# Patient Record
Sex: Male | Born: 1960 | Race: White | Hispanic: No | Marital: Married | Smoking: Former smoker
Health system: Southern US, Community
[De-identification: ages and names within clinical notes are randomized; demographics above are authoritative.]

## PROBLEM LIST (undated history)

## (undated) DIAGNOSIS — K219 Gastro-esophageal reflux disease without esophagitis: Secondary | ICD-10-CM

## (undated) DIAGNOSIS — T7840XA Allergy, unspecified, initial encounter: Secondary | ICD-10-CM

## (undated) DIAGNOSIS — I10 Essential (primary) hypertension: Secondary | ICD-10-CM

## (undated) DIAGNOSIS — Z9889 Other specified postprocedural states: Secondary | ICD-10-CM

## (undated) DIAGNOSIS — M659 Synovitis and tenosynovitis, unspecified: Secondary | ICD-10-CM

## (undated) DIAGNOSIS — E785 Hyperlipidemia, unspecified: Secondary | ICD-10-CM

## (undated) DIAGNOSIS — R112 Nausea with vomiting, unspecified: Secondary | ICD-10-CM

## (undated) DIAGNOSIS — M1990 Unspecified osteoarthritis, unspecified site: Secondary | ICD-10-CM

## (undated) DIAGNOSIS — Z973 Presence of spectacles and contact lenses: Secondary | ICD-10-CM

## (undated) DIAGNOSIS — M7711 Lateral epicondylitis, right elbow: Secondary | ICD-10-CM

## (undated) DIAGNOSIS — B029 Zoster without complications: Secondary | ICD-10-CM

## (undated) DIAGNOSIS — M7701 Medial epicondylitis, right elbow: Secondary | ICD-10-CM

## (undated) HISTORY — PX: UPPER GI ENDOSCOPY: SHX6162

## (undated) HISTORY — DX: Synovitis and tenosynovitis, unspecified: M65.9

## (undated) HISTORY — PX: WISDOM TOOTH EXTRACTION: SHX21

## (undated) HISTORY — DX: Hyperlipidemia, unspecified: E78.5

## (undated) HISTORY — PX: FRACTURE SURGERY: SHX138

## (undated) HISTORY — DX: Essential (primary) hypertension: I10

## (undated) HISTORY — DX: Zoster without complications: B02.9

## (undated) HISTORY — DX: Lateral epicondylitis, right elbow: M77.11

## (undated) HISTORY — DX: Allergy, unspecified, initial encounter: T78.40XA

## (undated) HISTORY — PX: VASECTOMY: SHX75

## (undated) HISTORY — DX: Medial epicondylitis, right elbow: M77.01

---
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
## 1980-09-04 HISTORY — PX: ORIF METACARPAL FRACTURE: SUR940

## 1997-09-04 HISTORY — PX: SHOULDER ARTHROSCOPY W/ LABRAL REPAIR: SHX2399

## 2000-05-24 ENCOUNTER — Encounter (INDEPENDENT_AMBULATORY_CARE_PROVIDER_SITE_OTHER): Payer: Self-pay | Admitting: Specialist

## 2000-05-24 ENCOUNTER — Other Ambulatory Visit: Admission: RE | Admit: 2000-05-24 | Discharge: 2000-05-24 | Payer: Self-pay | Admitting: Urology

## 2002-06-13 ENCOUNTER — Encounter: Admission: RE | Admit: 2002-06-13 | Discharge: 2002-06-13 | Payer: Self-pay | Admitting: Internal Medicine

## 2002-06-13 ENCOUNTER — Encounter: Payer: Self-pay | Admitting: Internal Medicine

## 2002-11-27 ENCOUNTER — Emergency Department (HOSPITAL_COMMUNITY): Admission: EM | Admit: 2002-11-27 | Discharge: 2002-11-27 | Payer: Self-pay | Admitting: Emergency Medicine

## 2003-09-05 HISTORY — PX: SHOULDER ARTHROSCOPY W/ ROTATOR CUFF REPAIR: SHX2400

## 2003-12-07 ENCOUNTER — Ambulatory Visit (HOSPITAL_BASED_OUTPATIENT_CLINIC_OR_DEPARTMENT_OTHER): Admission: RE | Admit: 2003-12-07 | Discharge: 2003-12-07 | Payer: Self-pay | Admitting: Orthopedic Surgery

## 2004-08-10 ENCOUNTER — Encounter: Admission: RE | Admit: 2004-08-10 | Discharge: 2004-08-10 | Payer: Self-pay | Admitting: Neurosurgery

## 2004-08-25 ENCOUNTER — Encounter: Admission: RE | Admit: 2004-08-25 | Discharge: 2004-08-25 | Payer: Self-pay | Admitting: Neurosurgery

## 2004-10-28 ENCOUNTER — Encounter: Admission: RE | Admit: 2004-10-28 | Discharge: 2004-10-28 | Payer: Self-pay | Admitting: Neurosurgery

## 2005-03-29 ENCOUNTER — Emergency Department (HOSPITAL_COMMUNITY): Admission: EM | Admit: 2005-03-29 | Discharge: 2005-03-29 | Payer: Self-pay | Admitting: Emergency Medicine

## 2005-08-02 ENCOUNTER — Encounter: Admission: RE | Admit: 2005-08-02 | Discharge: 2005-08-02 | Payer: Self-pay | Admitting: Neurosurgery

## 2005-08-16 ENCOUNTER — Encounter: Admission: RE | Admit: 2005-08-16 | Discharge: 2005-08-16 | Payer: Self-pay | Admitting: Neurosurgery

## 2009-06-08 ENCOUNTER — Ambulatory Visit: Payer: Self-pay | Admitting: Gastroenterology

## 2009-06-17 ENCOUNTER — Ambulatory Visit: Payer: Self-pay | Admitting: Gastroenterology

## 2009-06-17 HISTORY — PX: COLONOSCOPY: SHX174

## 2011-01-20 ENCOUNTER — Ambulatory Visit: Payer: 59 | Attending: Internal Medicine | Admitting: Physical Therapy

## 2011-01-20 ENCOUNTER — Ambulatory Visit: Payer: 59

## 2011-01-20 DIAGNOSIS — M2560 Stiffness of unspecified joint, not elsewhere classified: Secondary | ICD-10-CM | POA: Insufficient documentation

## 2011-01-20 DIAGNOSIS — M545 Low back pain, unspecified: Secondary | ICD-10-CM | POA: Insufficient documentation

## 2011-01-20 DIAGNOSIS — IMO0001 Reserved for inherently not codable concepts without codable children: Secondary | ICD-10-CM | POA: Insufficient documentation

## 2011-01-20 NOTE — Op Note (Signed)
NAME:  Andrew Nolan, Andrew Nolan                           ACCOUNT NO.:  [id number]   MEDICAL RECORD NO.:  [id number]                   PATIENT TYPE:  AMB   LOCATION:  DSC                                  FACILITY:  [facility]   PHYSICIAN:  Robert A. Thurston Hole, M.D.              DATE OF BIRTH:  [birth year]   DATE OF PROCEDURE:  12/07/2003  DATE OF DISCHARGE:                                 OPERATIVE REPORT   PREOPERATIVE DIAGNOSIS:  Left shoulder partial rotator cuff tear with  partial labrum tear and impingement.   POSTOPERATIVE DIAGNOSIS:  Left shoulder partial rotator cuff tear with  partial labrum tear and impingement.   OPERATION PERFORMED:  1. Left shoulder examination under anesthesia followed by arthroscopic     partial labral tear debridement and partial rotator cuff  tear     debridement.  2. Left shoulder subacromial decompression.   SURGEON:  Elana Alm. Thurston Hole, M.D.   ASSISTANT:  Julien Girt, P.A.   ANESTHESIA:  General.   OPERATIVE TIME:  45 minutes.   COMPLICATIONS:  None.   INDICATIONS FOR PROCEDURE:  Andrew Nolan is a 50 year old gentleman who injured his  left shoulder in November 2004 with a pulling type injury.  He has had  significant persistent pain with exam and MRI documenting a partial rotator  cuff tear, who has failed conservative care and is now to undergo  arthroscopy.   DESCRIPTION OF PROCEDURE:  Andrew Nolan was brought to the operating room on  December 07, 2003 after an interscalene block had been placed in the holding  room by anesthesia.  He was placed on the operating table in supine  position.  After being placed under general anesthesia, his left shoulder  was examined under anesthesia.  He had full range of motion and the shoulder  was stable to ligamentous exam.  He was then placed in beach chair position  and the shoulder and arm was prepped using sterile DuraPrep using sterile  technique.  Originally through a posterior arthroscopic portal, the  arthroscope  with a pump attached was placed and through an anterior portal  an arthroscopic probe was placed.  On initial inspection, the articular  cartilage in the glenohumeral joint was intact, anterior labrum partial  tearing 25% which was debrided.  Inferior labrum and anterior inferior  glenohumeral ligament complex was intact.  Superior labrum, biceps tendon  anchor was intact.  The biceps tendon was intact.  The posterior labrum  partial tearing 25 to 30% and this was debrided as well.  Rotator cuff on  the articular surface showed no evidence of a tear.  Inferior capsular  recess was free of pathology.  Anterior inferior glenohumeral ligament  complex was intact.  The subacromial space was entered and a lateral  arthroscopic portal was made.  Moderately thickened bursitis was resected.  Underneath this, the rotator cuff showed partial tearing and fraying of the  supraspinatus  and infraspinatus which was debrided.  A complete tear was not  found.  Impingement was noted and a subacromial decompression was carried  out as well as a CA ligament release.  The acromioclavicular joint was not  disturbed.  The shoulder could be brought through a full range of motion  with no impingement on the rotator cuff after this was done.  At this point  it was felt that all pathology had been satisfactorily addressed.  The  instruments were removed.  The portals were closed with 3-0 nylon suture.  Sterile dressings and a sling applied.  The patient then awakened and taken  to recovery room in stable condition.   FOLLOW UP:  Andrew Nolan will be followed as an outpatient on Vicodin and  Naprosyn.  See back in the office in a week for sutures out and follow-up.                                               Robert A. Thurston Hole, M.D.    RAW/MEDQ  D:  12/07/2003  T:  12/07/2003  Job:  [id number]

## 2011-01-25 ENCOUNTER — Ambulatory Visit: Payer: 59

## 2011-01-27 ENCOUNTER — Ambulatory Visit: Payer: 59 | Admitting: Physical Therapy

## 2011-01-31 ENCOUNTER — Ambulatory Visit: Payer: 59

## 2011-02-02 ENCOUNTER — Ambulatory Visit: Payer: 59

## 2011-02-07 ENCOUNTER — Ambulatory Visit: Payer: 59 | Attending: Internal Medicine

## 2011-02-07 DIAGNOSIS — M2560 Stiffness of unspecified joint, not elsewhere classified: Secondary | ICD-10-CM | POA: Insufficient documentation

## 2011-02-07 DIAGNOSIS — M545 Low back pain, unspecified: Secondary | ICD-10-CM | POA: Insufficient documentation

## 2011-02-07 DIAGNOSIS — IMO0001 Reserved for inherently not codable concepts without codable children: Secondary | ICD-10-CM | POA: Insufficient documentation

## 2011-02-09 ENCOUNTER — Ambulatory Visit: Payer: 59

## 2011-02-14 ENCOUNTER — Ambulatory Visit: Payer: 59

## 2011-02-16 ENCOUNTER — Ambulatory Visit: Payer: 59

## 2012-08-02 ENCOUNTER — Ambulatory Visit (INDEPENDENT_AMBULATORY_CARE_PROVIDER_SITE_OTHER): Payer: 59 | Admitting: Family Medicine

## 2012-08-02 VITALS — BP 142/82 | HR 64 | Temp 98.8°F | Resp 17 | Ht 71.5 in | Wt 205.0 lb

## 2012-08-02 DIAGNOSIS — H6090 Unspecified otitis externa, unspecified ear: Secondary | ICD-10-CM

## 2012-08-02 DIAGNOSIS — H60399 Other infective otitis externa, unspecified ear: Secondary | ICD-10-CM

## 2012-08-02 MED ORDER — OFLOXACIN 0.3 % OT SOLN: 10.0000 [drp] | Freq: Every day | OTIC | Status: DC

## 2012-08-02 MED ORDER — ANTIPYRINE-BENZOCAINE 5.4-1.4 % OT SOLN: 3.0000 [drp] | OTIC | Status: DC | PRN

## 2012-08-02 NOTE — Patient Instructions (Addendum)
Please let me know if you are not better in the next couple of days 

## 2012-08-02 NOTE — Progress Notes (Signed)
[facility] [address] [place] [address] [phone]  Date:  08/02/2012   Name:  Andrew Nolan   DOB:  1960-10-28   MRN:  [id number]  PCP:  No primary provider on file.    Chief Complaint: Otalgia   History of Present Illness:  Andrew Nolan is a 51 y.o. very pleasant male patient who presents with the following:  He has noted pain in his right ear.  It had seemed congested for a few days, but 2 days ago it began to hurt more.  It was very painful last night.  His glasses being over his ear and touching the ear causes tenderness He has tried sudafed and tylenol- helped some.  He has not noted a fever.   He has not had a ST, cough, etc.  Some sinus congestion but this is not unusual for him.    He is generally healthy, no medications or known health problems  There is no problem list on file for this patient.   History reviewed. No pertinent past medical history.  Past Surgical History  Procedure Date  . Fracture surgery   . Vasectomy     History  Substance Use Topics  . Smoking status: Never Smoker   . Smokeless tobacco: Not on file  . Alcohol Use: No    History reviewed. No pertinent family history.  Allergies  Allergen Reactions  . Codeine     REACTION: itching    Medication list has been reviewed and updated.  No current outpatient prescriptions on file prior to visit.    Review of Systems:  As per HPI- otherwise negative.   Physical Examination: Filed Vitals:   08/02/12 1054  BP: 142/82  Pulse: 64  Temp: 98.8 F (37.1 C)  Resp: 17   Filed Vitals:   08/02/12 1054  Height: 5' 11.5" (1.816 m)  Weight: 205 lb (92.987 kg)   Body mass index is 28.19 kg/(m^2). Ideal Body Weight: Weight in (lb) to have BMI = 25: 181.4   GEN: WDWN, NAD, Non-toxic, A & O x 3 HEENT: Atraumatic, Normocephalic. Neck supple. No masses, No LAD. Bilateral TM wnl.  Left external canal normal, right canal with slight swelling, redness and  discharge.  Nasal cavity normal, oropharynx normal.  PEERL,EOMI.   Ears and Nose: No external deformity. CV: RRR, No M/G/R. No JVD. No thrill. No extra heart sounds. PULM: CTA B, no wheezes, crackles, rhonchi. No retractions. No resp. distress. No accessory muscle use EXTR: No c/c/e NEURO Normal gait.  PSYCH: Normally interactive. Conversant. Not depressed or anxious appearing.  Calm demeanor.    Assessment and Plan: 1. Otitis externa  ofloxacin (FLOXIN OTIC) 0.3 % otic solution, antipyrine-benzocaine (AURALGAN) otic solution   Treat with floxin otic drops and auralgan drops as needed.  Patient (or parent if minor) instructed to return to clinic or call if not better in 2 day(s).   Abbe Amsterdam, MD

## 2012-11-03 ENCOUNTER — Ambulatory Visit (INDEPENDENT_AMBULATORY_CARE_PROVIDER_SITE_OTHER): Payer: 59 | Admitting: Emergency Medicine

## 2012-11-03 VITALS — BP 162/80 | HR 98 | Temp 98.0°F | Resp 17 | Ht 71.5 in | Wt 202.0 lb

## 2012-11-03 DIAGNOSIS — B029 Zoster without complications: Secondary | ICD-10-CM

## 2012-11-03 MED ORDER — VALACYCLOVIR HCL 1 G PO TABS: 1000.0000 mg | ORAL_TABLET | Freq: Three times a day (TID) | ORAL | Status: DC

## 2012-11-03 NOTE — Patient Instructions (Addendum)

## 2012-11-03 NOTE — Progress Notes (Signed)
[facility] [address] [place] [address] [phone]  Date:  11/03/2012   Name:  Andrew Nolan   DOB:  10-08-1960   MRN:  [id number]  PCP:  No primary provider on file.    Chief Complaint: Sinusitis and Facial Swelling   History of Present Illness:  Andrew Nolan is a 52 y.o. very pleasant male patient who presents with the following:  Awoke this morning with erythema and swelling around left eye, particularly involving the lower lid.  No fever or chills.  No visual symptoms.  Some headache on the left side.  Pain in teeth. No nasal congestion or drainage.  No ear pain.  No coryza, nausea or vomiting or cough.  No wheezing or shortness of breath.  No improvement with over the counter medications or other home remedies.   There is no problem list on file for this patient.   Past Medical History  Diagnosis Date  . Allergy     Past Surgical History  Procedure Laterality Date  . Fracture surgery    . Vasectomy      History  Substance Use Topics  . Smoking status: Never Smoker   . Smokeless tobacco: Not on file  . Alcohol Use: No    History reviewed. No pertinent family history.  Allergies  Allergen Reactions  . Codeine     REACTION: itching    Medication list has been reviewed and updated.  No current outpatient prescriptions on file prior to visit.   No current facility-administered medications on file prior to visit.    Review of Systems:  As per HPI, otherwise negative.    Physical Examination: Filed Vitals:   11/03/12 1500  BP: 162/80  Pulse: 98  Temp: 98 F (36.7 C)  Resp: 17   Filed Vitals:   11/03/12 1500  Height: 5' 11.5" (1.816 m)  Weight: 202 lb (91.627 kg)   Body mass index is 27.78 kg/(m^2). Ideal Body Weight: Weight in (lb) to have BMI = 25: 181.4  GEN: WDWN, NAD, Non-toxic, A & O x 3 HEENT: Atraumatic, Normocephalic. Neck supple. No masses, No LAD. Ears and Nose: No external deformity. CV: RRR, No  M/G/R. No JVD. No thrill. No extra heart sounds. PULM: CTA B, no wheezes, crackles, rhonchi. No retractions. No resp. distress. No accessory muscle use. ABD: S, NT, ND, +BS. No rebound. No HSM. EXTR: No c/c/e NEURO Normal gait.  PSYCH: Normally interactive. Conversant. Not depressed or anxious appearing.  Calm demeanor.  SKIN:  Erythematous eruption left forehead, cheek and temple  Assessment and Plan: Shingles Valtrex vicodin Follow up tomorrow with eye doctor to insure no infection.  Carmelina Dane, MD

## 2013-11-05 ENCOUNTER — Encounter (HOSPITAL_BASED_OUTPATIENT_CLINIC_OR_DEPARTMENT_OTHER): Payer: Self-pay | Admitting: *Deleted

## 2013-11-05 NOTE — Progress Notes (Signed)
No labs needed

## 2013-11-11 ENCOUNTER — Other Ambulatory Visit: Payer: Self-pay | Admitting: Physician Assistant

## 2013-11-11 ENCOUNTER — Ambulatory Visit (HOSPITAL_BASED_OUTPATIENT_CLINIC_OR_DEPARTMENT_OTHER): Payer: 59 | Admitting: Anesthesiology

## 2013-11-11 ENCOUNTER — Encounter (HOSPITAL_BASED_OUTPATIENT_CLINIC_OR_DEPARTMENT_OTHER)
Admission: RE | Disposition: A | Discharge status: Home / Self Care | Payer: Self-pay | Source: Ambulatory Visit | Attending: Orthopedic Surgery

## 2013-11-11 ENCOUNTER — Encounter (HOSPITAL_BASED_OUTPATIENT_CLINIC_OR_DEPARTMENT_OTHER): Payer: 59 | Admitting: Anesthesiology

## 2013-11-11 ENCOUNTER — Encounter: Payer: Self-pay | Admitting: Physician Assistant

## 2013-11-11 ENCOUNTER — Encounter (HOSPITAL_BASED_OUTPATIENT_CLINIC_OR_DEPARTMENT_OTHER): Payer: Self-pay

## 2013-11-11 ENCOUNTER — Encounter (HOSPITAL_BASED_OUTPATIENT_CLINIC_OR_DEPARTMENT_OTHER): Payer: Self-pay | Admitting: Anesthesiology

## 2013-11-11 ENCOUNTER — Ambulatory Visit (HOSPITAL_BASED_OUTPATIENT_CLINIC_OR_DEPARTMENT_OTHER)
Admission: RE | Admit: 2013-11-11 | Discharge: 2013-11-11 | Disposition: A | Discharge status: Home / Self Care | Payer: 59 | Source: Ambulatory Visit | Attending: Orthopedic Surgery | Admitting: Orthopedic Surgery

## 2013-11-11 DIAGNOSIS — M659 Synovitis and tenosynovitis, unspecified: Secondary | ICD-10-CM | POA: Diagnosis present

## 2013-11-11 DIAGNOSIS — M7711 Lateral epicondylitis, right elbow: Secondary | ICD-10-CM | POA: Diagnosis present

## 2013-11-11 DIAGNOSIS — M65939 Unspecified synovitis and tenosynovitis, unspecified forearm: Secondary | ICD-10-CM

## 2013-11-11 DIAGNOSIS — M7700 Medial epicondylitis, unspecified elbow: Secondary | ICD-10-CM | POA: Insufficient documentation

## 2013-11-11 DIAGNOSIS — M7710 Lateral epicondylitis, unspecified elbow: Secondary | ICD-10-CM | POA: Insufficient documentation

## 2013-11-11 DIAGNOSIS — M7701 Medial epicondylitis, right elbow: Secondary | ICD-10-CM

## 2013-11-11 DIAGNOSIS — K219 Gastro-esophageal reflux disease without esophagitis: Secondary | ICD-10-CM | POA: Insufficient documentation

## 2013-11-11 HISTORY — DX: Other specified postprocedural states: Z98.890

## 2013-11-11 HISTORY — DX: Nausea with vomiting, unspecified: R11.2

## 2013-11-11 HISTORY — DX: Gastro-esophageal reflux disease without esophagitis: K21.9

## 2013-11-11 HISTORY — DX: Presence of spectacles and contact lenses: Z97.3

## 2013-11-11 HISTORY — PX: LATERAL EPICONDYLE RELEASE: SHX1958

## 2013-11-11 HISTORY — DX: Unspecified osteoarthritis, unspecified site: M19.90

## 2013-11-11 LAB — POCT HEMOGLOBIN-HEMACUE: Hemoglobin: 18.1 g/dL — ABNORMAL HIGH (ref 13.0–17.0)

## 2013-11-11 SURGERY — TENNIS ELBOW RELEASE/NIRSCHEL PROCEDURE
Anesthesia: General | Site: Elbow | Laterality: Right

## 2013-11-11 MED ORDER — BUPIVACAINE HCL (PF) 0.25 % IJ SOLN
INTRAMUSCULAR | Status: AC
  Filled 2013-11-11: qty 30

## 2013-11-11 MED ORDER — FENTANYL CITRATE 0.05 MG/ML IJ SOLN
INTRAMUSCULAR | Status: AC
  Filled 2013-11-11: qty 2

## 2013-11-11 MED ORDER — FENTANYL CITRATE 0.05 MG/ML IJ SOLN
50.0000 ug | INTRAMUSCULAR | Status: DC | PRN
  Administered 2013-11-11: 100 ug via INTRAVENOUS

## 2013-11-11 MED ORDER — ONDANSETRON HCL 4 MG/2ML IJ SOLN
INTRAMUSCULAR | Status: DC | PRN
  Administered 2013-11-11: 4 mg via INTRAVENOUS

## 2013-11-11 MED ORDER — LACTATED RINGERS IV SOLN: INTRAVENOUS | Status: DC

## 2013-11-11 MED ORDER — SUCCINYLCHOLINE CHLORIDE 20 MG/ML IJ SOLN
INTRAMUSCULAR | Status: DC | PRN
  Administered 2013-11-11: 50 mg via INTRAVENOUS

## 2013-11-11 MED ORDER — MIDAZOLAM HCL 2 MG/2ML IJ SOLN
1.0000 mg | INTRAMUSCULAR | Status: DC | PRN
  Administered 2013-11-11: 2 mg via INTRAVENOUS

## 2013-11-11 MED ORDER — MIDAZOLAM HCL 2 MG/2ML IJ SOLN
INTRAMUSCULAR | Status: AC
  Filled 2013-11-11: qty 2

## 2013-11-11 MED ORDER — BUPIVACAINE HCL (PF) 0.25 % IJ SOLN
INTRAMUSCULAR | Status: DC | PRN
  Administered 2013-11-11: 1 mL via INTRA_ARTICULAR

## 2013-11-11 MED ORDER — FENTANYL CITRATE 0.05 MG/ML IJ SOLN
INTRAMUSCULAR | Status: DC | PRN
  Administered 2013-11-11: 100 ug via INTRAVENOUS

## 2013-11-11 MED ORDER — CHLORHEXIDINE GLUCONATE 4 % EX LIQD: 60.0000 mL | Freq: Once | CUTANEOUS | Status: DC

## 2013-11-11 MED ORDER — FENTANYL CITRATE 0.05 MG/ML IJ SOLN: 25.0000 ug | INTRAMUSCULAR | Status: DC | PRN

## 2013-11-11 MED ORDER — CEFAZOLIN SODIUM-DEXTROSE 2-3 GM-% IV SOLR
INTRAVENOUS | Status: DC | PRN
  Administered 2013-11-11: 2 g via INTRAVENOUS

## 2013-11-11 MED ORDER — LIDOCAINE HCL (CARDIAC) 20 MG/ML IV SOLN
INTRAVENOUS | Status: DC | PRN
  Administered 2013-11-11: 40 mg via INTRAVENOUS

## 2013-11-11 MED ORDER — METHYLPREDNISOLONE ACETATE 40 MG/ML IJ SUSP
INTRAMUSCULAR | Status: AC
  Filled 2013-11-11: qty 1

## 2013-11-11 MED ORDER — LACTATED RINGERS IV SOLN
INTRAVENOUS | Status: DC
  Administered 2013-11-11 (×2): via INTRAVENOUS

## 2013-11-11 MED ORDER — FENTANYL CITRATE 0.05 MG/ML IJ SOLN
INTRAMUSCULAR | Status: AC
  Filled 2013-11-11: qty 6

## 2013-11-11 MED ORDER — DEXAMETHASONE SODIUM PHOSPHATE 4 MG/ML IJ SOLN
INTRAMUSCULAR | Status: DC | PRN
  Administered 2013-11-11: 10 mg via INTRAVENOUS

## 2013-11-11 MED ORDER — METHYLPREDNISOLONE ACETATE 40 MG/ML IJ SUSP
INTRAMUSCULAR | Status: DC | PRN
  Administered 2013-11-11: 40 mg via INTRA_ARTICULAR

## 2013-11-11 MED ORDER — PROPOFOL 10 MG/ML IV BOLUS
INTRAVENOUS | Status: DC | PRN
  Administered 2013-11-11: 200 mg via INTRAVENOUS

## 2013-11-11 MED ORDER — CEFAZOLIN SODIUM-DEXTROSE 2-3 GM-% IV SOLR: 2.0000 g | INTRAVENOUS | Status: DC

## 2013-11-11 MED ORDER — CEFAZOLIN SODIUM-DEXTROSE 2-3 GM-% IV SOLR
INTRAVENOUS | Status: AC
  Filled 2013-11-11: qty 50

## 2013-11-11 MED ORDER — BUPIVACAINE-EPINEPHRINE PF 0.25-1:200000 % IJ SOLN
INTRAMUSCULAR | Status: AC
  Filled 2013-11-11: qty 30

## 2013-11-11 MED ORDER — MIDAZOLAM HCL 5 MG/5ML IJ SOLN
INTRAMUSCULAR | Status: DC | PRN
  Administered 2013-11-11: 2 mg via INTRAVENOUS

## 2013-11-11 SURGICAL SUPPLY — 78 items
APL SKNCLS STERI-STRIP NONHPOA (GAUZE/BANDAGES/DRESSINGS)
BANDAGE ELASTIC 4 VELCRO ST LF (GAUZE/BANDAGES/DRESSINGS) ×5 IMPLANT
BANDAGE ELASTIC 6 VELCRO ST LF (GAUZE/BANDAGES/DRESSINGS) IMPLANT
BENZOIN TINCTURE PRP APPL 2/3 (GAUZE/BANDAGES/DRESSINGS) IMPLANT
BIT DRILL 5/64X5 DISP (BIT) ×2 IMPLANT
BLADE SURG 15 STRL LF DISP TIS (BLADE) ×1 IMPLANT
BLADE SURG 15 STRL SS (BLADE) ×6
BNDG CMPR 9X4 STRL LF SNTH (GAUZE/BANDAGES/DRESSINGS) ×1
BNDG COHESIVE 4X5 TAN STRL (GAUZE/BANDAGES/DRESSINGS) ×3 IMPLANT
BNDG ESMARK 4X9 LF (GAUZE/BANDAGES/DRESSINGS) ×3 IMPLANT
BNDG GAUZE ELAST 4 BULKY (GAUZE/BANDAGES/DRESSINGS) ×3 IMPLANT
CLOSURE WOUND 1/2 X4 (GAUZE/BANDAGES/DRESSINGS) ×1
COVER TABLE BACK 60X90 (DRAPES) ×3 IMPLANT
CUFF TOURNIQUET SINGLE 18IN (TOURNIQUET CUFF) ×2 IMPLANT
DRAPE EXTREMITY T 121X128X90 (DRAPE) ×3 IMPLANT
DRAPE U 20/CS (DRAPES) ×3 IMPLANT
DRAPE U-SHAPE 47X51 STRL (DRAPES) ×4 IMPLANT
DRSG EMULSION OIL 3X3 NADH (GAUZE/BANDAGES/DRESSINGS) IMPLANT
DURAPREP 26ML APPLICATOR (WOUND CARE) ×3 IMPLANT
ELECT REM PT RETURN 9FT ADLT (ELECTROSURGICAL) ×3
ELECTRODE REM PT RTRN 9FT ADLT (ELECTROSURGICAL) ×1 IMPLANT
GAUZE XEROFORM 1X8 LF (GAUZE/BANDAGES/DRESSINGS) ×2 IMPLANT
GLOVE BIO SURGEON STRL SZ7 (GLOVE) ×3 IMPLANT
GLOVE BIOGEL PI IND STRL 7.0 (GLOVE) ×1 IMPLANT
GLOVE BIOGEL PI IND STRL 7.5 (GLOVE) ×1 IMPLANT
GLOVE BIOGEL PI INDICATOR 7.0 (GLOVE) ×6
GLOVE BIOGEL PI INDICATOR 7.5 (GLOVE) ×2
GLOVE ECLIPSE 6.5 STRL STRAW (GLOVE) ×4 IMPLANT
GLOVE SS BIOGEL STRL SZ 7.5 (GLOVE) ×1 IMPLANT
GLOVE SUPERSENSE BIOGEL SZ 7.5 (GLOVE) ×2
GOWN STRL REUS W/ TWL LRG LVL3 (GOWN DISPOSABLE) ×3 IMPLANT
GOWN STRL REUS W/TWL LRG LVL3 (GOWN DISPOSABLE) ×12
LOOP VESSEL MAXI BLUE (MISCELLANEOUS) IMPLANT
NDL ADDISON D1/2 CIR (NEEDLE) IMPLANT
NDL HYPO 25X1 1.5 SAFETY (NEEDLE) ×1 IMPLANT
NDL SAFETY ECLIPSE 18X1.5 (NEEDLE) IMPLANT
NDL SUT 6 .5 CRC .975X.05 MAYO (NEEDLE) IMPLANT
NEEDLE ADDISON D1/2 CIR (NEEDLE) IMPLANT
NEEDLE FISTULA 1/2 CIRCLE (NEEDLE) ×1 IMPLANT
NEEDLE HYPO 18GX1.5 SHARP (NEEDLE) ×3
NEEDLE HYPO 25X1 1.5 SAFETY (NEEDLE) ×3 IMPLANT
NEEDLE MAYO TAPER (NEEDLE)
NS IRRIG 1000ML POUR BTL (IV SOLUTION) ×3 IMPLANT
PACK BASIN DAY SURGERY FS (CUSTOM PROCEDURE TRAY) ×3 IMPLANT
PAD CAST 3X4 CTTN HI CHSV (CAST SUPPLIES) ×1 IMPLANT
PAD CAST 4YDX4 CTTN HI CHSV (CAST SUPPLIES) ×1 IMPLANT
PADDING CAST ABS 4INX4YD NS (CAST SUPPLIES) ×2
PADDING CAST ABS COTTON 4X4 ST (CAST SUPPLIES) ×1 IMPLANT
PADDING CAST COTTON 3X4 STRL (CAST SUPPLIES)
PADDING CAST COTTON 4X4 STRL (CAST SUPPLIES) ×3
PASSER SUT SWANSON 36MM LOOP (INSTRUMENTS) IMPLANT
PENCIL BUTTON HOLSTER BLD 10FT (ELECTRODE) ×3 IMPLANT
SHEET MEDIUM DRAPE 40X70 STRL (DRAPES) ×2 IMPLANT
SLEEVE SCD COMPRESS KNEE MED (MISCELLANEOUS) ×2 IMPLANT
SPLINT FAST PLASTER 5X30 (CAST SUPPLIES) ×20
SPLINT PLASTER CAST FAST 5X30 (CAST SUPPLIES) ×10 IMPLANT
SPONGE GAUZE 4X4 12PLY (GAUZE/BANDAGES/DRESSINGS) ×3 IMPLANT
STOCKINETTE 4X48 STRL (DRAPES) IMPLANT
STOCKINETTE IMPERVIOUS LG (DRAPES) ×3 IMPLANT
STRIP CLOSURE SKIN 1/2X4 (GAUZE/BANDAGES/DRESSINGS) ×1 IMPLANT
SUCTION FRAZIER TIP 10 FR DISP (SUCTIONS) IMPLANT
SUT ETHILON 4 0 PS 2 18 (SUTURE) ×1 IMPLANT
SUT ETHILON 5 0 P 3 18 (SUTURE)
SUT FIBERWIRE 2-0 18 17.9 3/8 (SUTURE)
SUT MNCRL AB 3-0 PS2 18 (SUTURE) IMPLANT
SUT NYLON ETHILON 5-0 P-3 1X18 (SUTURE) IMPLANT
SUT PROLENE 3 0 PS 2 (SUTURE) ×2 IMPLANT
SUT VIC AB 0 CT1 27 (SUTURE)
SUT VIC AB 0 CT1 27XBRD ANBCTR (SUTURE) IMPLANT
SUT VIC AB 2-0 SH 27 (SUTURE) ×3
SUT VIC AB 2-0 SH 27XBRD (SUTURE) ×1 IMPLANT
SUTURE FIBERWR 2-0 18 17.9 3/8 (SUTURE) IMPLANT
SYR BULB 3OZ (MISCELLANEOUS) ×3 IMPLANT
SYR CONTROL 10ML LL (SYRINGE) ×3 IMPLANT
TOWEL OR 17X24 6PK STRL BLUE (TOWEL DISPOSABLE) ×6 IMPLANT
TUBE CONNECTING 20'X1/4 (TUBING)
TUBE CONNECTING 20X1/4 (TUBING) IMPLANT
UNDERPAD 30X30 INCONTINENT (UNDERPADS AND DIAPERS) ×3 IMPLANT

## 2013-11-11 NOTE — H&P (Signed)
Andrew Nolan is an 53 y.o. male.   Chief Complaint: Right elbow medial and lateral pain.  ECRB partial tear HPI: Andrew Nolan is seen for follow up evaluation from his recurrent right elbow medial epicondylitis.  He continues to have significant pain.  We have injected both his medial and his lateral epicondylar regions over the past four months, but the pain has recurred.  Pain with twisting and turning.  He re-injured his right elbow lifting some skis recently and previously injured it prior to this on a camping trip three months ago.  No numbness or tingling. MRI that showed partial tearing of the lateral epicondylar tendons with tendonitis of the medial epicondylar tendons but no tearing medially. He has pain on both sides of his elbow.   Past Medical History  Diagnosis Date  . Allergy   . Wears glasses   . GERD (gastroesophageal reflux disease)     occ  . Arthritis   . PONV (postoperative nausea and vomiting)   . Lateral epicondylitis of right elbow   . Medial epicondylitis of right elbow   . ECRB (extensor carpi radialis brevis) tenosynovitis     Past Surgical History  Procedure Laterality Date  . Vasectomy    . Fracture surgery      lt thumb-age 57  . Shoulder arthroscopy w/ rotator cuff repair  2005    left  . Shoulder arthroscopy w/ labral repair  1999    right  . Orif wrist fracture  1975    left  . Orif radius & ulna fractures  1976    compound-right  . Orif metacarpal fracture  1982    right hand    Family History: Negative for Diabetes, Heart Disease, and Hypertension  Social History:  reports that he has never smoked. He does not have any smokeless tobacco history on file. He reports that he drinks alcohol. He reports that he does not use illicit drugs.  Allergies:  Allergies  Allergen Reactions  . Codeine     REACTION: itching   No current outpatient prescriptions on file prior to visit.   No current facility-administered medications on file prior to visit.      (Not in a hospital admission)  No results found for this or any previous visit (from the past 48 hour(s)). No results found.  Review of Systems  Constitutional: Negative.   HENT: Negative.   Eyes: Negative.   Respiratory: Negative.   Cardiovascular: Negative.   Gastrointestinal: Negative.   Genitourinary: Negative.   Musculoskeletal:       Medial and lateral elbow pain  Skin: Negative.   Neurological: Negative.   Endo/Heme/Allergies: Negative.     Blood pressure 147/91, height 5\' 11"  (1.803 m), weight 90.719 kg (200 lb). Physical Exam  Constitutional: He is oriented to person, place, and time. He appears well-developed and well-nourished.  HENT:  Head: Normocephalic and atraumatic.  Mouth/Throat: Oropharynx is clear and moist.  Eyes: Conjunctivae and EOM are normal. Pupils are equal, round, and reactive to light.  Neck: Neck supple.  Cardiovascular: Normal rate and regular rhythm.   Respiratory: Effort normal.  GI: Soft.  Genitourinary:  Not pertinent to current symptomatology therefore not examined.  Musculoskeletal:  Of his right elbow reveals pain on the medial and lateral epicondyle.  Pain on gripping and flexor tendon stressing. No instability.  Full range of motion.  Neurologic exam is intact.   Neurological: He is alert and oriented to person, place, and time.  Skin: Skin is  warm and dry.  Psychiatric: He has a normal mood and affect. His behavior is normal.     Assessment Patient Active Problem List   Diagnosis Date Noted  . ECRB (extensor carpi radialis brevis) tenosynovitis   . Medial epicondylitis of right elbow   . Lateral epicondylitis of right elbow     Plan I told him with this finding I recommend right elbow lateral release debridement and repair and medial epicondylar cortisone injection at the time of surgery. Discussed risks benefits and possible complications of the surgery in detail and he understands this completely.     Adda Stokes J 11/11/2013, 8:59 AM    

## 2013-11-11 NOTE — Transfer of Care (Signed)
Immediate Anesthesia Transfer of Care Note  Patient: PINK MAYE  Procedure(s) Performed: Procedure(s): RIGHT TENOTOMY ELBOW LATERAL EPICONDYLITIS TENNIS ELBOW/RIGHT ELBOW INJECTION ASPIRATION ARTHROCENTESIS INTERMEDULLARY JOINT BURSA (Right)  Patient Location: PACU  Anesthesia Type:General  Level of Consciousness: awake, alert , oriented and patient cooperative  Airway & Oxygen Therapy: Patient Spontanous Breathing and Patient connected to face mask oxygen  Post-op Assessment: Report given to PACU RN  Post vital signs: Reviewed and stable  Complications: No apparent anesthesia complications

## 2013-11-11 NOTE — Discharge Instructions (Addendum)
°  Post Anesthesia Home Care Instructions ° °Activity: °Get plenty of rest for the remainder of the day. A responsible adult should stay with you for 24 hours following the procedure.  °For the next 24 hours, DO NOT: °-Drive a car °-Operate machinery °-Drink alcoholic beverages °-Take any medication unless instructed by your physician °-Make any legal decisions or sign important papers. ° °Meals: °Start with liquid foods such as gelatin or soup. Progress to regular foods as tolerated. Avoid greasy, spicy, heavy foods. If nausea and/or vomiting occur, drink only clear liquids until the nausea and/or vomiting subsides. Call your physician if vomiting continues. ° °Special Instructions/Symptoms: °Your throat may feel dry or sore from the anesthesia or the breathing tube placed in your throat during surgery. If this causes discomfort, gargle with warm salt water. The discomfort should disappear within 24 hours. ° °Regional Anesthesia Blocks ° °1. Numbness or the inability to move the "blocked" extremity may last from 3-48 hours after placement. The length of time depends on the medication injected and your individual response to the medication. If the numbness is not going away after 48 hours, call your surgeon. ° °2. The extremity that is blocked will need to be protected until the numbness is gone and the  Strength has returned. Because you cannot feel it, you will need to take extra care to avoid injury. Because it may be weak, you may have difficulty moving it or using it. You may not know what position it is in without looking at it while the block is in effect. ° °3. For blocks in the legs and feet, returning to weight bearing and walking needs to be done carefully. You will need to wait until the numbness is entirely gone and the strength has returned. You should be able to move your leg and foot normally before you try and bear weight or walk. You will need someone to be with you when you first try to ensure you  do not fall and possibly risk injury. ° °4. Bruising and tenderness at the needle site are common side effects and will resolve in a few days. ° °5. Persistent numbness or new problems with movement should be communicated to the surgeon or the [facility] ([phone])/ [facility] ([phone]). °

## 2013-11-11 NOTE — Anesthesia Preprocedure Evaluation (Addendum)
Anesthesia Evaluation  Patient identified by MRN, date of birth, ID band Patient awake    Reviewed: Allergy & Precautions, H&P , NPO status , Patient's Chart, lab work & pertinent test results  History of Anesthesia Complications (+) PONV  Airway Mallampati: II      Dental   Pulmonary neg pulmonary ROS,  breath sounds clear to auscultation        Cardiovascular negative cardio ROS  Rhythm:Regular Rate:Normal     Neuro/Psych    GI/Hepatic Neg liver ROS, GERD-  ,  Endo/Other  negative endocrine ROS  Renal/GU negative Renal ROS     Musculoskeletal   Abdominal   Peds  Hematology   Anesthesia Other Findings   Reproductive/Obstetrics                           Anesthesia Physical Anesthesia Plan  ASA: II  Anesthesia Plan: General   Post-op Pain Management:    Induction: Intravenous  Airway Management Planned: Oral ETT  Additional Equipment:   Intra-op Plan:   Post-operative Plan: Extubation in OR  Informed Consent: I have reviewed the patients History and Physical, chart, labs and discussed the procedure including the risks, benefits and alternatives for the proposed anesthesia with the patient or authorized representative who has indicated his/her understanding and acceptance.   Dental advisory given  Plan Discussed with: CRNA and Anesthesiologist  Anesthesia Plan Comments:         Anesthesia Quick Evaluation

## 2013-11-11 NOTE — Anesthesia Procedure Notes (Addendum)
Anesthesia Regional Block:  Supraclavicular block  Pre-Anesthetic Checklist: ,, timeout performed, Correct Patient, Correct Site, Correct Laterality, Correct Procedure, Correct Position, site marked, Risks and benefits discussed,  Surgical consent,  Pre-op evaluation,  At surgeon's request and post-op pain management  Laterality: Right  Prep: chloraprep       Needles:   Needle Type: Stimulator Needle - 40          Additional Needles:  Procedures: Doppler guided Supraclavicular block  Nerve Stimulator or Paresthesia:  Response: 0.5 mA,   Additional Responses:   Narrative:  Start time: 11/11/2013 11:40 AM End time: 11/11/2013 11:55 AM Injection made incrementally with aspirations every 5 mL.  Performed by: Personally  Anesthesiologist: Dr. Oletta Lamas   Procedure Name: Intubation Date/Time: 11/11/2013 12:37 PM Performed by: Toula Moos Pre-anesthesia Checklist: Patient identified, Emergency Drugs available, Suction available, Patient being monitored and Timeout performed Patient Re-evaluated:Patient Re-evaluated prior to inductionOxygen Delivery Method: Circle System Utilized Preoxygenation: Pre-oxygenation with 100% oxygen Intubation Type: IV induction Ventilation: Mask ventilation without difficulty Laryngoscope Size: Miller and 3 Grade View: Grade III Tube type: Oral Tube size: 8.0 mm Number of attempts: 1 Airway Equipment and Method: stylet and oral airway Placement Confirmation: ETT inserted through vocal cords under direct vision,  positive ETCO2 and breath sounds checked- equal and bilateral Secured at: 22 cm Tube secured with: Tape Dental Injury: Teeth and Oropharynx as per pre-operative assessment

## 2013-11-11 NOTE — Progress Notes (Signed)
Assisted Dr. Oletta Lamas with right, ultrasound guided, supraclavicular block. Side rails up, monitors on throughout procedure. See vital signs in flow sheet. Tolerated Procedure well.

## 2013-11-11 NOTE — Anesthesia Postprocedure Evaluation (Signed)
  Anesthesia Post-op Note  Patient: Andrew Nolan  Procedure(s) Performed: Procedure(s): RIGHT TENOTOMY ELBOW LATERAL EPICONDYLITIS TENNIS ELBOW/RIGHT ELBOW INJECTION ASPIRATION ARTHROCENTESIS INTERMEDULLARY JOINT BURSA (Right)  Patient Location: PACU  Anesthesia Type:General  Level of Consciousness: awake  Airway and Oxygen Therapy: Patient Spontanous Breathing  Post-op Pain: mild  Post-op Assessment: Post-op Vital signs reviewed  Post-op Vital Signs: Reviewed  Complications: No apparent anesthesia complications

## 2013-11-11 NOTE — Interval H&P Note (Signed)
History and Physical Interval Note:  11/11/2013 12:26 PM  Andrew Nolan  has presented today for surgery, with the diagnosis of RIGHT EPICONDYLITIS -LATERAL (TENNIS ELBOW), EPICONDYLITIS - MEDIAL (ELBOW)  The various methods of treatment have been discussed with the patient and family. After consideration of risks, benefits and other options for treatment, the patient has consented to  Procedure(s): RIGHT TENOTOMY ELBOW LATERAL EPICONDYLITIS TENNIS ELBOW/RIGHT ELBOW INJECTION ASPIRATION ARTHROCENTESIS INTERMEDULLARY JOINT BURSA (Right) as a surgical intervention .  The patient's history has been reviewed, patient examined, no change in status, stable for surgery.  I have reviewed the patient's chart and labs.  Questions were answered to the patient's satisfaction.     [name]

## 2013-11-11 NOTE — H&P (View-Only) (Signed)
Andrew Nolan is an 53 y.o. male.   Chief Complaint: Right elbow medial and lateral pain.  ECRB partial tear HPI: Andrew Nolan is seen for follow up evaluation from his recurrent right elbow medial epicondylitis.  He continues to have significant pain.  We have injected both his medial and his lateral epicondylar regions over the past four months, but the pain has recurred.  Pain with twisting and turning.  He re-injured his right elbow lifting some skis recently and previously injured it prior to this on a camping trip three months ago.  No numbness or tingling. MRI that showed partial tearing of the lateral epicondylar tendons with tendonitis of the medial epicondylar tendons but no tearing medially. He has pain on both sides of his elbow.   Past Medical History  Diagnosis Date  . Allergy   . Wears glasses   . GERD (gastroesophageal reflux disease)     occ  . Arthritis   . PONV (postoperative nausea and vomiting)   . Lateral epicondylitis of right elbow   . Medial epicondylitis of right elbow   . ECRB (extensor carpi radialis brevis) tenosynovitis     Past Surgical History  Procedure Laterality Date  . Vasectomy    . Fracture surgery      lt thumb-age 57  . Shoulder arthroscopy w/ rotator cuff repair  2005    left  . Shoulder arthroscopy w/ labral repair  1999    right  . Orif wrist fracture  1975    left  . Orif radius & ulna fractures  1976    compound-right  . Orif metacarpal fracture  1982    right hand    Family History: Negative for Diabetes, Heart Disease, and Hypertension  Social History:  reports that he has never smoked. He does not have any smokeless tobacco history on file. He reports that he drinks alcohol. He reports that he does not use illicit drugs.  Allergies:  Allergies  Allergen Reactions  . Codeine     REACTION: itching   No current outpatient prescriptions on file prior to visit.   No current facility-administered medications on file prior to visit.      (Not in a hospital admission)  No results found for this or any previous visit (from the past 48 hour(s)). No results found.  Review of Systems  Constitutional: Negative.   HENT: Negative.   Eyes: Negative.   Respiratory: Negative.   Cardiovascular: Negative.   Gastrointestinal: Negative.   Genitourinary: Negative.   Musculoskeletal:       Medial and lateral elbow pain  Skin: Negative.   Neurological: Negative.   Endo/Heme/Allergies: Negative.     Blood pressure 147/91, height 5\' 11"  (1.803 m), weight 90.719 kg (200 lb). Physical Exam  Constitutional: He is oriented to person, place, and time. He appears well-developed and well-nourished.  HENT:  Head: Normocephalic and atraumatic.  Mouth/Throat: Oropharynx is clear and moist.  Eyes: Conjunctivae and EOM are normal. Pupils are equal, round, and reactive to light.  Neck: Neck supple.  Cardiovascular: Normal rate and regular rhythm.   Respiratory: Effort normal.  GI: Soft.  Genitourinary:  Not pertinent to current symptomatology therefore not examined.  Musculoskeletal:  Of his right elbow reveals pain on the medial and lateral epicondyle.  Pain on gripping and flexor tendon stressing. No instability.  Full range of motion.  Neurologic exam is intact.   Neurological: He is alert and oriented to person, place, and time.  Skin: Skin is  warm and dry.  Psychiatric: He has a normal mood and affect. His behavior is normal.     Assessment Patient Active Problem List   Diagnosis Date Noted  . ECRB (extensor carpi radialis brevis) tenosynovitis   . Medial epicondylitis of right elbow   . Lateral epicondylitis of right elbow     Plan I told him with this finding I recommend right elbow lateral release debridement and repair and medial epicondylar cortisone injection at the time of surgery. Discussed risks benefits and possible complications of the surgery in detail and he understands this completely.     Ashawna Hanback J 11/11/2013, 8:59 AM

## 2013-11-12 ENCOUNTER — Encounter (HOSPITAL_BASED_OUTPATIENT_CLINIC_OR_DEPARTMENT_OTHER): Payer: Self-pay | Admitting: Orthopedic Surgery

## 2013-11-12 NOTE — Op Note (Signed)
NAME:  OBE, AHLERS NO.:  [id number]  MEDICAL RECORD NO.:  [id number]  LOCATION:                               FACILITY:  [facility]  PHYSICIAN:  Audree Camel. Noemi Chapel, M.D. DATE OF BIRTH:  Feb 07, 1961  DATE OF PROCEDURE:  11/11/2013 DATE OF DISCHARGE:  11/11/2013                              OPERATIVE REPORT   PREOPERATIVE DIAGNOSES: 1. Right elbow lateral epicondylitis with partial tear. 2. Right elbow medial epicondylitis.  POSTOPERATIVE DIAGNOSES: 1. Right elbow lateral epicondylitis with partial tear. 2. Right elbow medial epicondylitis.  PROCEDURE: 1. Right elbow exam under anesthesia, followed by lateral epicondylar     debridement release in repair. 2. Right elbow medial epicondylar cortisone injection.  SURGEON:  Audree Camel. Noemi Chapel, M.D.  ASSISTANT:  Kirstin Shepperson, PA-C.  ANESTHESIA:  General.  OPERATIVE TIME:  45 minutes.  COMPLICATIONS:  None.  INDICATION FOR PROCEDURE:  Andrew Nolan is a 53 year old who has had significant right elbow pain both laterally and medially for over a year with exam and MRI documenting lateral epicondylar tear of the epicondylar tendons, a partial tear in medial epicondylitis.  He has failed conservative care and is now to undergo right elbow lateral epicondylar release, debridement and repair and medial epicondylar cortisone injection.  DESCRIPTION:  Andrew Nolan was brought to the operating room on November 11, 2013, after a axillary block had been placed on OR room by anesthesia. He was placed on operative table in supine position.  He received antibiotics preoperatively for prophylaxis.  After being placed under general anesthesia, his right elbow was examined.  He had full range of motion.  His elbow was stable to ligamentous exam.  The right arm was prepped using sterile DuraPrep and draped using sterile technique.  Time- out procedure was called and the correct right elbow identified. Initially, the medial  epicondylar injection was made with 40 mg of Depo- Medrol and 1 mL of 0.25% Marcaine.  At this point, the arm was exsanguinated and the tourniquet elevated 250 mm.  Initially, through a 3 cm curvilinear incision based over the lateral epicondyle, initial exposure was made.  The underlying subcutaneous tissues were incised along with skin incision.  Initial incision was made and the fascia over the lateral epicondyle was incised longitudinally.  The ECRB and ECRL tendons were identified and found to have tendinosis with partial tearing.  They were released off their lateral epicondylar insertion, but the radial head capitellar joint was not entered.  Radial nerve carefully protected.  A partial epicondylectomy was carried out for small spur in this region.  At this point, 4 small drill holes were made in the lateral epicondyle and after the tendinosis and partial tearing, it had been sharply debrided.  A 2-0 FiberWire suture was placed through the drill holes and through the remaining normal tendon in a mattress suture technique and then tied down over bone, reattaching the tendons approximately 2 mm distal to their initial insertion point under less tension.  At this point, there was found to be excellent repair.  The elbow could be brought through full range of motion with excellent stability, but not excessive tension on the repair.  At this point, it was felt that all pathology had been satisfactorily addressed.  The wound was irrigated and then the fascia was closed over the repair with a running 2-0 Vicryl suture.  Subcutaneous tissue was closed with 2-0 Vicryl.  Subcuticular layer closed with 4-0 Prolene and a sterile dressings were applied and a long-arm splint.  After the tourniquet was released and the patient awakened and taken to recovery room in stable condition.  Needle and sponge counts correct x2 at the end of the case.  FOLLOWUP CARE:  Andrew Nolan will be followed as an  outpatient on Valium with an long-arm splint.  Seen back in the office in a week for wound check and followup.     Andrew Nolan A. Noemi Chapel, M.D.     RAW/MEDQ  D:  11/11/2013  T:  11/12/2013  Job:  [id number]

## 2014-03-29 ENCOUNTER — Encounter (HOSPITAL_COMMUNITY): Payer: Self-pay | Admitting: Emergency Medicine

## 2014-03-29 ENCOUNTER — Emergency Department (HOSPITAL_COMMUNITY)
Admission: EM | Admit: 2014-03-29 | Discharge: 2014-03-29 | Disposition: A | Discharge status: Home / Self Care | Payer: 59 | Attending: Emergency Medicine | Admitting: Emergency Medicine

## 2014-03-29 DIAGNOSIS — Y939 Activity, unspecified: Secondary | ICD-10-CM | POA: Diagnosis not present

## 2014-03-29 DIAGNOSIS — T6391XA Toxic effect of contact with unspecified venomous animal, accidental (unintentional), initial encounter: Secondary | ICD-10-CM | POA: Diagnosis not present

## 2014-03-29 DIAGNOSIS — R61 Generalized hyperhidrosis: Secondary | ICD-10-CM | POA: Insufficient documentation

## 2014-03-29 DIAGNOSIS — Y929 Unspecified place or not applicable: Secondary | ICD-10-CM | POA: Insufficient documentation

## 2014-03-29 DIAGNOSIS — R0789 Other chest pain: Secondary | ICD-10-CM | POA: Diagnosis not present

## 2014-03-29 DIAGNOSIS — Z79899 Other long term (current) drug therapy: Secondary | ICD-10-CM | POA: Diagnosis not present

## 2014-03-29 DIAGNOSIS — Z8739 Personal history of other diseases of the musculoskeletal system and connective tissue: Secondary | ICD-10-CM | POA: Insufficient documentation

## 2014-03-29 DIAGNOSIS — T63461A Toxic effect of venom of wasps, accidental (unintentional), initial encounter: Secondary | ICD-10-CM | POA: Insufficient documentation

## 2014-03-29 DIAGNOSIS — K219 Gastro-esophageal reflux disease without esophagitis: Secondary | ICD-10-CM | POA: Diagnosis not present

## 2014-03-29 DIAGNOSIS — T63441A Toxic effect of venom of bees, accidental (unintentional), initial encounter: Secondary | ICD-10-CM

## 2014-03-29 LAB — I-STAT TROPONIN, ED: TROPONIN I, POC: 0.01 ng/mL (ref 0.00–0.08)

## 2014-03-29 MED ORDER — FAMOTIDINE IN NACL 20-0.9 MG/50ML-% IV SOLN
20.0000 mg | Freq: Once | INTRAVENOUS | Status: AC
  Administered 2014-03-29: 20 mg via INTRAVENOUS
  Filled 2014-03-29: qty 50

## 2014-03-29 MED ORDER — DIPHENHYDRAMINE HCL 50 MG/ML IJ SOLN
50.0000 mg | Freq: Once | INTRAMUSCULAR | Status: AC
  Administered 2014-03-29: 50 mg via INTRAVENOUS
  Filled 2014-03-29: qty 1

## 2014-03-29 MED ORDER — FAMOTIDINE 20 MG PO TABS: 20.0000 mg | ORAL_TABLET | Freq: Two times a day (BID) | ORAL | Status: DC

## 2014-03-29 MED ORDER — DIPHENHYDRAMINE HCL 25 MG PO TABS: 25.0000 mg | ORAL_TABLET | Freq: Four times a day (QID) | ORAL | Status: DC

## 2014-03-29 NOTE — Discharge Instructions (Signed)
Anaphylactic Reaction °An anaphylactic reaction is a sudden, severe allergic reaction. It affects the whole body. It can be life threatening. You may need to stay in the hospital.  °HOME CARE °· Wear a medical bracelet or necklace that lists your allergy. °· Carry your allergy kit or medicine shot to treat severe allergic reactions with you. These can save your life. °· Do not drive until medicine from your shot has worn off, unless your doctor says it is okay. °· If you have hives or a rash: °¨ Take medicine as told by your doctor. °¨ You may take over-the-counter antihistamine medicine. °¨ Place cold cloths on your skin. Take baths in cool water. Avoid hot baths and hot showers. °GET HELP RIGHT AWAY IF:  °· Your mouth is puffy (swollen), or you have trouble breathing. °· You start making whistling sounds when you breathe (wheezing). °· You have a tight feeling in your chest or throat. °· You have a rash, hives, puffiness, or itching on your body. °· You throw up (vomit) or have watery poop (diarrhea). °· You feel dizzy or pass out (faint). °· You think you are having an allergic reaction. °· You have new symptoms. °This is an emergency. Use your medicine shot or allergy kit as told. Call your local emergency services (911 in [place] Even if you feel better after the shot, you need to go to the hospital emergency department. °MAKE SURE YOU:  °· Understand these instructions. °· Will watch your condition. °· Will get help right away if you are not doing well or get worse. °Document Released: 02/07/2008 Document Revised: 02/20/2012 Document Reviewed: 11/22/2011 °ExitCare® Patient Information ©2015 [employer]. This information is not intended to replace advice given to you by your health care provider. Make sure you discuss any questions you have with your health care provider. ° °

## 2014-03-29 NOTE — ED Provider Notes (Signed)
CSN: [id number]     Arrival date & time 03/29/14  1117 History   First MD Initiated Contact with Patient 03/29/14 1158     Chief Complaint  Patient presents with  . Insect Bite   HPI Comments: Patient is a 53 y.o. Male who presents to the ED with allergic reaction after yellow jacket sting approximately one hour ago.  Patient states that he was stung on his left ear.  He states shortly after he felt flushed, hot, and then noticed some hives which popped up on his arms, hand, buttocks, and chest.  Patient also states that he had some centralized burning chest pain. Patient states that he took a 25 mg benadryl tablet at home which had expired with little relief.  Patient became anxious and came here.  He denies swelling of the face, shortness of breath, swelling of the neck, dizziness, and changes in bowel or bladder habits.      The history is provided by the patient. No language interpreter was used.    Past Medical History  Diagnosis Date  . Allergy   . Wears glasses   . GERD (gastroesophageal reflux disease)     occ  . Arthritis   . PONV (postoperative nausea and vomiting)   . Lateral epicondylitis of right elbow   . Medial epicondylitis of right elbow   . ECRB (extensor carpi radialis brevis) tenosynovitis    Past Surgical History  Procedure Laterality Date  . Vasectomy    . Fracture surgery      lt thumb-age 16  . Shoulder arthroscopy w/ rotator cuff repair  2005    left  . Shoulder arthroscopy w/ labral repair  1999    right  . Orif wrist fracture  1975    left  . Orif radius & ulna fractures  1976    compound-right  . Orif metacarpal fracture  1982    right hand  . Lateral epicondyle release Right 11/11/2013    Procedure: RIGHT TENOTOMY ELBOW LATERAL EPICONDYLITIS TENNIS ELBOW/RIGHT ELBOW INJECTION ASPIRATION ARTHROCENTESIS INTERMEDULLARY JOINT BURSA;  Surgeon: Lorn Junes, MD;  Location: [facility];  Service: Orthopedics;  Laterality: Right;   History  reviewed. No pertinent family history. History  Substance Use Topics  . Smoking status: Never Smoker   . Smokeless tobacco: Not on file  . Alcohol Use: Yes     Comment: daily 2 drinks    Review of Systems  Constitutional: Positive for diaphoresis. Negative for fever, chills and fatigue.  Respiratory: Positive for chest tightness. Negative for cough, shortness of breath and wheezing.   Cardiovascular: Positive for chest pain. Negative for palpitations and leg swelling.  Skin: Positive for color change and rash.  All other systems reviewed and are negative.     Allergies  Codeine and Yellow jacket venom  Home Medications   Prior to Admission medications   Medication Sig Start Date End Date Taking? Authorizing Provider  diphenhydrAMINE (BENADRYL) 25 MG tablet Take 25 mg by mouth every 6 (six) hours as needed for itching or allergies.   Yes Historical Provider, MD  diphenhydrAMINE (BENADRYL) 25 MG tablet Take 1 tablet (25 mg total) by mouth every 6 (six) hours. 03/29/14   Oniel Meleski A Forcucci, PA-C  famotidine (PEPCID) 20 MG tablet Take 1 tablet (20 mg total) by mouth 2 (two) times daily. 03/29/14   Shamar Kracke A Forcucci, PA-C   BP 159/87  Pulse 105  Temp(Src) 98.3 F (36.8 C) (Oral)  SpO2 97% Physical  Exam  Nursing note and vitals reviewed. Constitutional: He is oriented to person, place, and time. He appears well-developed and well-nourished. No distress.  HENT:  Head: Normocephalic and atraumatic. Head is without right periorbital erythema and without left periorbital erythema.  Mouth/Throat: Uvula is midline, oropharynx is clear and moist and mucous membranes are normal. No oropharyngeal exudate, posterior oropharyngeal edema, posterior oropharyngeal erythema or tonsillar abscesses.  Face and neck flushed and warm to the touch.  No obvious facial or neck swelling.  Eyes: Conjunctivae and EOM are normal. Pupils are equal, round, and reactive to light. No scleral icterus.  Neck:  Normal range of motion. Neck supple. No JVD present. No thyromegaly present.  Cardiovascular: Normal rate, regular rhythm, normal heart sounds and intact distal pulses.  Exam reveals no gallop and no friction rub.   No murmur heard. Pulmonary/Chest: Effort normal and breath sounds normal. No respiratory distress. He has no wheezes. He has no rales. He exhibits no tenderness.  Musculoskeletal: Normal range of motion.  Lymphadenopathy:    He has no cervical adenopathy.  Neurological: He is alert and oriented to person, place, and time. No cranial nerve deficit.  Skin: Skin is warm and dry. Rash noted. Rash is urticarial. He is not diaphoretic. There is erythema.  Urticarial rash located over the arms, chest, buttocks and back.  Rash is without vessicles, petechia, purpura, and blanches with pressure.    Psychiatric: He has a normal mood and affect. His behavior is normal. Judgment and thought content normal.    ED Course  Procedures (including critical care time) [facility], ED    Imaging Review No results found.   EKG Interpretation   Date/Time:  Sunday March 29 2014 12:38:34 EDT Ventricular Rate:  68 PR Interval:  161 QRS Duration: 93 QT Interval:  409 QTC Calculation: 435 R Axis:   37 Text Interpretation:  Sinus rhythm Borderline T abnormalities, inferior  leads Baseline wander in lead(s) II III aVF No old tracing to compare  Confirmed by [facility]  MD, ELLIOTT [phone]) on 03/29/2014 1:03:36 PM      MDM   Final diagnoses:  Allergic reaction to bee sting, accidental or unintentional, initial encounter   Patient is a 53 y.o. Male who presents to the ED with allergic reaction to a yellow jacket sting.  Patient had facial flushing and also urticarial lesions on exam.  Patient treated here with IV fluids, 50 mg benadryl, and 20 mg IV pepcid.  Patient had improvement of symptoms here in the ED.  EKG performed here in the ED showed potential T wave  abnormalities in the inferior leads, but there are no EKGs to compare to.  Troponin here in the ED was negative.  Patient is able to tolerate PO fluids here in the ED.  He is stable for discharge at this time.  Patient was given return precautions of anaphylaxis.  He will be given a prescription for benadryl and pepcid.  He is to follow-up with his PCP this week to ensure clearance.  Dr. Eulis Foster was consulted about the above treatment plan.  He agrees at this time.      Cherylann Parr, PA-C 03/29/14 1324

## 2014-03-29 NOTE — ED Notes (Signed)
Patient was stung by yellow jacket behind left ear. Patient c/o itching on hands and body. Patient took 25mg  of benadryl about 30 minutes ago. Patient in no respiratory distress. Lungs clear

## 2014-03-30 NOTE — ED Provider Notes (Signed)
Medical screening examination/treatment/procedure(s) were performed by non-physician practitioner and as supervising physician I was immediately available for consultation/collaboration.   EKG Interpretation   Date/Time:  Sunday March 29 2014 12:38:34 EDT Ventricular Rate:  68 PR Interval:  161 QRS Duration: 93 QT Interval:  409 QTC Calculation: 435 R Axis:   37 Text Interpretation:  Sinus rhythm Borderline T abnormalities, inferior  leads Baseline wander in lead(s) II III aVF No old tracing to compare  Confirmed by [facility]  MD, Alaiah Lundy [phone]) on 03/29/2014 1:03:36 PM       Richarda Blade, MD 03/30/14 1143

## 2015-09-27 ENCOUNTER — Encounter: Payer: Self-pay | Admitting: Gastroenterology

## 2016-12-15 ENCOUNTER — Encounter (INDEPENDENT_AMBULATORY_CARE_PROVIDER_SITE_OTHER): Payer: Self-pay

## 2016-12-15 ENCOUNTER — Encounter: Payer: Self-pay | Admitting: Gastroenterology

## 2016-12-15 ENCOUNTER — Other Ambulatory Visit (INDEPENDENT_AMBULATORY_CARE_PROVIDER_SITE_OTHER): Payer: Commercial Managed Care - HMO

## 2016-12-15 ENCOUNTER — Ambulatory Visit (INDEPENDENT_AMBULATORY_CARE_PROVIDER_SITE_OTHER): Payer: Commercial Managed Care - HMO | Admitting: Nurse Practitioner

## 2016-12-15 ENCOUNTER — Encounter: Payer: Self-pay | Admitting: Nurse Practitioner

## 2016-12-15 VITALS — BP 120/80 | HR 63 | Ht 71.0 in | Wt 221.0 lb

## 2016-12-15 DIAGNOSIS — K219 Gastro-esophageal reflux disease without esophagitis: Secondary | ICD-10-CM

## 2016-12-15 DIAGNOSIS — Z7289 Other problems related to lifestyle: Secondary | ICD-10-CM

## 2016-12-15 DIAGNOSIS — Z789 Other specified health status: Secondary | ICD-10-CM | POA: Diagnosis not present

## 2016-12-15 DIAGNOSIS — F1010 Alcohol abuse, uncomplicated: Secondary | ICD-10-CM

## 2016-12-15 LAB — HEPATIC FUNCTION PANEL
ALBUMIN: 4.4 g/dL (ref 3.5–5.2)
ALT: 27 U/L (ref 0–53)
AST: 17 U/L (ref 0–37)
Alkaline Phosphatase: 68 U/L (ref 39–117)
BILIRUBIN TOTAL: 0.9 mg/dL (ref 0.2–1.2)
Bilirubin, Direct: 0.2 mg/dL (ref 0.0–0.3)
TOTAL PROTEIN: 7.2 g/dL (ref 6.0–8.3)

## 2016-12-15 NOTE — Progress Notes (Addendum)
HPI: Patient is a 56 year old male, previously followed by Dr. Deatra Ina. He had a normal.screening colonoscopy in October 2010, Patient is referred by PCP Dr. Levin Erp for GERD. Patient was diagnosed with GERDr 5 plus years ago. He started PPI a couple of yrs ago and has varied the dosing between 1-2 times a day. He's made dietary changes, reduced alcohol intake. He averages 7 drinks a week. Despite these interventions patient continues to have at least minor symptoms of GERD 3 times a week. He describes epigastric and chest "discomfort", worse at night. No regurgitation. No odynophagia or dysphagia. No nausea or weight loss. No significant NSAID use. He tries to go to bed on an empty stomach, he sleeps with several pillows. He takes a.m. PPI dose in the morning before breakfast and the p.m. dose at bedtime. No lower GI complaints.    Past Medical History:  Diagnosis Date  . Allergy   . Arthritis   . ECRB (extensor carpi radialis brevis) tenosynovitis   . GERD (gastroesophageal reflux disease)    occ  . Lateral epicondylitis of right elbow   . Medial epicondylitis of right elbow   . PONV (postoperative nausea and vomiting)   . Wears glasses      Past Surgical History:  Procedure Laterality Date  . FRACTURE SURGERY     lt thumb-age 9  . LATERAL EPICONDYLE RELEASE Right 11/11/2013   Procedure: RIGHT TENOTOMY ELBOW LATERAL EPICONDYLITIS TENNIS ELBOW/RIGHT ELBOW INJECTION ASPIRATION ARTHROCENTESIS INTERMEDULLARY JOINT BURSA;  Surgeon: Lorn Junes, MD;  Location: [facility];  Service: Orthopedics;  Laterality: Right;  . ORIF METACARPAL FRACTURE  1982   right hand  . ORIF [place]   compound-right  . ORIF WRIST FRACTURE  1975   left  . SHOULDER ARTHROSCOPY W/ LABRAL REPAIR  1999   right  . SHOULDER ARTHROSCOPY W/ ROTATOR CUFF REPAIR  2005   left  . VASECTOMY     History reviewed. No pertinent family history. Social History  Substance  Use Topics  . Smoking status: Never Smoker  . Smokeless tobacco: Never Used  . Alcohol use Yes     Comment: daily 2 drinks   Current Outpatient Prescriptions  Medication Sig Dispense Refill  . losartan (COZAAR) 100 MG tablet Take 100 mg by mouth daily.    Marland Kitchen omeprazole (PRILOSEC) 20 MG capsule Take 20 mg by mouth 2 (two) times daily before a meal.     No current facility-administered medications for this visit.    Allergies  Allergen Reactions  . Codeine     REACTION: itching  . Yellow Jacket Venom [Bee Venom] Hives and Itching     Review of Systems: All systems reviewed and negative except where noted in HPI.    Physical Exam: BP 120/80   Pulse 63   Ht 5\' 11"  (1.803 m)   Wt 221 lb (100.2 kg)   SpO2 96%   BMI 30.82 kg/m  Constitutional:  Well-developed, white male in no acute distress. Psychiatric: Normal mood and affect. Behavior is normal. EENT:  Conjunctivae are normal. No scleral icterus. Neck supple.  Cardiovascular: Normal rate, regular rhythm.  Pulmonary/chest: Effort normal and breath sounds normal. No wheezing, rales or rhonchi. Abdominal: Soft, nondistended, nontender. Bowel sounds active throughout. There are no masses palpable. No hepatomegaly. Extremities: no edema Neurological: Alert and oriented to person place and time. Skin: Skin is warm and dry. No rashes noted.   ASSESSMENT AND  PLAN:  1.  56 yo male with intermittent non-exertional  and non-radiating chest / epigastric discomfort. Discomfort persists  PPI, dietary changes, reduction of ETOH. Symptoms worse at night.  If EGD negative and pain persists then he may need imaging -gerd literature given -For further evaluation patient will be scheduled for EGD.  The risks and benefits of EGD were discussed and the patient agrees to proceed.   2. ETOH use, averages 7 ETOH beverage a week.  -check LFTs.   3. Colon cancer screening. Up to date on colonoscopy, next one due 2020.   Andrew Savoy, NP   12/15/2016, 10:45 AM  Cc:  Levin Erp, MD   Thank you for sending this case to me. I have reviewed the entire note, and the outlined plan seems appropriate.   Andrew Lund, MD

## 2016-12-15 NOTE — Patient Instructions (Signed)
If you are age 58 or older, your body mass index should be between 23-30. Your Body mass index is 30.82 kg/m. If this is out of the aforementioned range listed, please consider follow up with your Primary Care Provider.  If you are age 92 or younger, your body mass index should be between 19-25. Your Body mass index is 30.82 kg/m. If this is out of the aformentioned range listed, please consider follow up with your Primary Care Provider.   You have been scheduled for an endoscopy. Please follow written instructions given to you at your visit today. If you use inhalers (even only as needed), please bring them with you on the day of your procedure. Your physician has requested that you go to www.startemmi.com and enter the access code given to you at your visit today. This web site gives a general overview about your procedure. However, you should still follow specific instructions given to you by our office regarding your preparation for the procedure.  Your physician has requested that you go to the basement for the following lab work before leaving today: LFT's  GERD literature handout given.  Thank you for choosing me and [facility].  Tye Savoy, NLP

## 2016-12-27 ENCOUNTER — Encounter: Payer: Self-pay | Admitting: Gastroenterology

## 2016-12-27 ENCOUNTER — Ambulatory Visit (AMBULATORY_SURGERY_CENTER): Payer: Commercial Managed Care - HMO | Admitting: Gastroenterology

## 2016-12-27 VITALS — BP 143/96 | HR 69 | Temp 97.1°F | Resp 15 | Ht 71.0 in | Wt 221.0 lb

## 2016-12-27 DIAGNOSIS — K219 Gastro-esophageal reflux disease without esophagitis: Secondary | ICD-10-CM

## 2016-12-27 MED ORDER — SODIUM CHLORIDE 0.9 % IV SOLN: 500.0000 mL | INTRAVENOUS | Status: DC

## 2016-12-27 NOTE — Progress Notes (Signed)
A and O x3. Report to RN. Tolerated MAC anesthesia well.Teeth unchanged after procedure.

## 2016-12-27 NOTE — Progress Notes (Signed)
Pt's states no medical or surgical changes since previsit or office visit. 

## 2016-12-27 NOTE — Op Note (Signed)
[facility] Patient Name: Andrew Nolan Procedure Date: 12/27/2016 9:27 AM MRN: [id number] Endoscopist: Mallie Mussel L. Andrew Nolan , MD Age: 56 Referring MD:  Date of Birth: 1961-04-20 Gender: Male Account #: [id number] Procedure:                Upper GI endoscopy Indications:              Esophageal reflux symptoms that persist despite                            appropriate therapy Medicines:                Monitored Anesthesia Care Procedure:                Pre-Anesthesia Assessment:                           - Prior to the procedure, a History and Physical                            was performed, and patient medications and                            allergies were reviewed. The patient's tolerance of                            previous anesthesia was also reviewed. The risks                            and benefits of the procedure and the sedation                            options and risks were discussed with the patient.                            All questions were answered, and informed consent                            was obtained. Prior Anticoagulants: The patient has                            taken no previous anticoagulant or antiplatelet                            agents. ASA Grade Assessment: II - A patient with                            mild systemic disease. After reviewing the risks                            and benefits, the patient was deemed in                            satisfactory condition to undergo the procedure.  After obtaining informed consent, the endoscope was                            passed under direct vision. Throughout the                            procedure, the patient's blood pressure, pulse, and                            oxygen saturations were monitored continuously. The                            Endoscope was introduced through the mouth, and                            advanced to the second part of duodenum.  The upper                            GI endoscopy was accomplished without difficulty.                            The patient tolerated the procedure well. Scope In: Scope Out: Findings:                 The larynx was normal.                           The esophagus was normal.                           The stomach was normal.                           The cardia and gastric fundus were normal on                            retroflexion.                           The examined duodenum was normal. Complications:            No immediate complications. Estimated Blood Loss:     Estimated blood loss: none. Impression:               - Normal larynx.                           - Normal esophagus.                           - Normal stomach.                           - Normal examined duodenum.                           - No specimens collected. Recommendation:           - Patient has a contact  number available for                            emergencies. The signs and symptoms of potential                            delayed complications were discussed with the                            patient. Return to normal activities tomorrow.                            Written discharge instructions were provided to the                            patient.                           - Resume previous diet.                           - Continue present medications.                           - Follow an antireflux regimen, including moderate                            alcohol use and weight loss.                           As that occurs and symptoms improve, begin to                            decrease medicines by eliminating AM dose and                            monitoring symptoms. Follow up in GI clinic as                            needed. Andrew Nolan L. Andrew Carrow, MD 12/27/2016 9:44:38 AM This report has been signed electronically.

## 2016-12-27 NOTE — Patient Instructions (Signed)
YOU HAD AN ENDOSCOPIC PROCEDURE TODAY AT [facility]:   Refer to the procedure report that was given to you for any specific questions about what was found during the examination.  If the procedure report does not answer your questions, please call your gastroenterologist to clarify.  If you requested that your care partner not be given the details of your procedure findings, then the procedure report has been included in a sealed envelope for you to review at your convenience later.  YOU SHOULD EXPECT: Some feelings of bloating in the abdomen. Passage of more gas than usual.  Walking can help get rid of the air that was put into your GI tract during the procedure and reduce the bloating. If you had a lower endoscopy (such as a colonoscopy or flexible sigmoidoscopy) you may notice spotting of blood in your stool or on the toilet paper. If you underwent a bowel prep for your procedure, you may not have a normal bowel movement for a few days.  Please Note:  You might notice some irritation and congestion in your nose or some drainage.  This is from the oxygen used during your procedure.  There is no need for concern and it should clear up in a day or so.  SYMPTOMS TO REPORT IMMEDIATELY:    Following upper endoscopy (EGD)  Vomiting of blood or coffee ground material  New chest pain or pain under the shoulder blades  Painful or persistently difficult swallowing  New shortness of breath  Fever of 100F or higher  Black, tarry-looking stools  For urgent or emergent issues, a gastroenterologist can be reached at any hour by calling [phone].   DIET:  We do recommend a small meal at first, but then you may proceed to your regular diet.  Drink plenty of fluids but you should avoid alcoholic beverages for 24 hours.  ACTIVITY:  You should plan to take it easy for the rest of today and you should NOT DRIVE or use heavy machinery until tomorrow (because of the sedation medicines used  during the test).    FOLLOW UP: Our staff will call the number listed on your records the next business day following your procedure to check on you and address any questions or concerns that you may have regarding the information given to you following your procedure. If we do not reach you, we will leave a message.  However, if you are feeling well and you are not experiencing any problems, there is no need to return our call.  We will assume that you have returned to your regular daily activities without incident.  If any biopsies were taken you will be contacted by phone or by letter within the next 1-3 weeks.  Please call us at [phone] if you have not heard about the biopsies in 3 weeks.   Follow an antireflux regimen, including moderate alcohol use and weight loss.As that occurs and symptoms improve, begin to decrease medicines by elimanating AM dose and monitoring symptoms. Follow up in GI clinic as needed.   SIGNATURES/CONFIDENTIALITY: You and/or your care partner have signed paperwork which will be entered into your electronic medical record.  These signatures attest to the fact that that the information above on your After Visit Summary has been reviewed and is understood.  Full responsibility of the confidentiality of this discharge information lies with you and/or your care-partner.

## 2016-12-28 ENCOUNTER — Telehealth: Payer: Self-pay | Admitting: *Deleted

## 2016-12-28 NOTE — Telephone Encounter (Signed)
  Follow up Call-  Call back number 12/27/2016  Post procedure Call Back phone  # [phone]  Permission to leave phone message Yes  Some recent data might be hidden     Patient questions:  Do you have a fever, pain , or abdominal swelling? No. Pain Score  0 *  Have you tolerated food without any problems? Yes.    Have you been able to return to your normal activities? Yes.    Do you have any questions about your discharge instructions: Diet   No. Medications  No. Follow up visit  No.  Do you have questions or concerns about your Care? No.  Actions: * If pain score is 4 or above: No action needed, pain <4.

## 2017-06-05 ENCOUNTER — Other Ambulatory Visit: Payer: Self-pay | Admitting: Internal Medicine

## 2017-06-05 DIAGNOSIS — R161 Splenomegaly, not elsewhere classified: Secondary | ICD-10-CM

## 2017-06-06 ENCOUNTER — Other Ambulatory Visit: Payer: Commercial Managed Care - HMO

## 2017-06-08 ENCOUNTER — Ambulatory Visit
Admission: RE | Admit: 2017-06-08 | Discharge: 2017-06-08 | Disposition: A | Discharge status: Home / Self Care | Payer: Commercial Managed Care - HMO | Source: Ambulatory Visit | Attending: Internal Medicine | Admitting: Internal Medicine

## 2017-06-08 DIAGNOSIS — R161 Splenomegaly, not elsewhere classified: Secondary | ICD-10-CM

## 2018-05-13 ENCOUNTER — Other Ambulatory Visit (HOSPITAL_COMMUNITY): Payer: Self-pay | Admitting: Internal Medicine

## 2018-05-13 DIAGNOSIS — R079 Chest pain, unspecified: Secondary | ICD-10-CM

## 2018-05-16 ENCOUNTER — Ambulatory Visit (HOSPITAL_COMMUNITY)
Admission: RE | Admit: 2018-05-16 | Discharge: 2018-05-16 | Disposition: A | Discharge status: Home / Self Care | Payer: 59 | Source: Ambulatory Visit | Attending: Internal Medicine | Admitting: Internal Medicine

## 2018-05-16 DIAGNOSIS — R079 Chest pain, unspecified: Secondary | ICD-10-CM

## 2018-05-17 ENCOUNTER — Telehealth (HOSPITAL_COMMUNITY): Payer: Self-pay

## 2018-05-17 ENCOUNTER — Other Ambulatory Visit: Payer: Self-pay

## 2018-05-17 ENCOUNTER — Ambulatory Visit (HOSPITAL_COMMUNITY): Admission: RE | Admit: 2018-05-17 | Payer: 59 | Source: Ambulatory Visit

## 2018-05-17 ENCOUNTER — Encounter (HOSPITAL_COMMUNITY): Payer: 59

## 2018-05-17 ENCOUNTER — Inpatient Hospital Stay (HOSPITAL_COMMUNITY): Admission: RE | Admit: 2018-05-17 | Payer: 59 | Source: Ambulatory Visit

## 2018-05-17 DIAGNOSIS — R9439 Abnormal result of other cardiovascular function study: Secondary | ICD-10-CM

## 2018-05-17 NOTE — Telephone Encounter (Signed)
Encounter complete. 

## 2018-05-20 ENCOUNTER — Other Ambulatory Visit (HOSPITAL_COMMUNITY): Payer: 59

## 2018-05-21 ENCOUNTER — Ambulatory Visit (HOSPITAL_COMMUNITY)
Admission: RE | Admit: 2018-05-21 | Discharge: 2018-05-21 | Disposition: A | Discharge status: Home / Self Care | Payer: 59 | Source: Ambulatory Visit | Attending: Cardiology | Admitting: Cardiology

## 2018-05-21 DIAGNOSIS — R9439 Abnormal result of other cardiovascular function study: Secondary | ICD-10-CM | POA: Diagnosis present

## 2018-05-21 LAB — MYOCARDIAL PERFUSION IMAGING
Estimated workload: 12.4 METS
Exercise duration (min): 11 min
Exercise duration (sec): 18 s
LV dias vol: 91 mL (ref 62–150)
LVSYSVOL: 39 mL
MPHR: 161 {beats}/min
NUC STRESS TID: 0.83
Peak HR: 146 {beats}/min
Percent HR: 90 %
RPE: 17
Rest HR: 56 {beats}/min
SDS: 2
SRS: 0
SSS: 2

## 2018-05-21 MED ORDER — REGADENOSON 0.4 MG/5ML IV SOLN: 0.4000 mg | Freq: Once | INTRAVENOUS | Status: DC

## 2018-05-21 MED ORDER — TECHNETIUM TC 99M TETROFOSMIN IV KIT
26.6000 | PACK | Freq: Once | INTRAVENOUS | Status: AC | PRN
  Administered 2018-05-21: 26.6 via INTRAVENOUS
  Filled 2018-05-21: qty 27

## 2018-05-21 MED ORDER — TECHNETIUM TC 99M TETROFOSMIN IV KIT
8.3000 | PACK | Freq: Once | INTRAVENOUS | Status: AC | PRN
  Administered 2018-05-21: 8.3 via INTRAVENOUS
  Filled 2018-05-21: qty 9

## 2018-05-21 MED ORDER — AMINOPHYLLINE 25 MG/ML IV SOLN: 75.0000 mg | Freq: Once | INTRAVENOUS | Status: DC

## 2018-05-21 MED ORDER — TECHNETIUM TC 99M TETROFOSMIN IV KIT
25.1000 | PACK | Freq: Once | INTRAVENOUS | Status: DC | PRN
  Filled 2018-05-21: qty 26

## 2018-05-22 ENCOUNTER — Encounter (HOSPITAL_COMMUNITY): Payer: Self-pay | Admitting: Internal Medicine

## 2018-05-22 ENCOUNTER — Other Ambulatory Visit: Payer: Self-pay

## 2018-05-22 ENCOUNTER — Ambulatory Visit (HOSPITAL_COMMUNITY)
Admission: RE | Admit: 2018-05-22 | Discharge: 2018-05-22 | Disposition: A | Discharge status: Home / Self Care | Payer: 59 | Source: Ambulatory Visit | Attending: Internal Medicine | Admitting: Internal Medicine

## 2018-05-22 VITALS — BP 143/90 | HR 62 | Wt 222.0 lb

## 2018-05-22 DIAGNOSIS — Z885 Allergy status to narcotic agent status: Secondary | ICD-10-CM | POA: Insufficient documentation

## 2018-05-22 DIAGNOSIS — K219 Gastro-esophageal reflux disease without esophagitis: Secondary | ICD-10-CM | POA: Insufficient documentation

## 2018-05-22 DIAGNOSIS — R9439 Abnormal result of other cardiovascular function study: Secondary | ICD-10-CM

## 2018-05-22 DIAGNOSIS — Z79899 Other long term (current) drug therapy: Secondary | ICD-10-CM | POA: Insufficient documentation

## 2018-05-22 DIAGNOSIS — R079 Chest pain, unspecified: Secondary | ICD-10-CM | POA: Diagnosis not present

## 2018-05-22 DIAGNOSIS — I10 Essential (primary) hypertension: Secondary | ICD-10-CM | POA: Diagnosis not present

## 2018-05-22 DIAGNOSIS — R0789 Other chest pain: Secondary | ICD-10-CM

## 2018-05-22 NOTE — Progress Notes (Signed)
[facility] New Patient Note    PCP: Levin Erp, MD PCP-Cardiologist: No primary care provider on file.   HPI: Andrew Nolan is a 57 y.o. male with a history of GERD, arthritis, and HTN being seen today for further evaluation of chest discomfort and abnormal stress test.   He presents today for evaluation of CP. It started 2 weeks ago and has been constant. He first noticed mild chest discomfort  at 10,000 feet in [place] when he was walking. Still able to play raquetball and CP does not get worse. He has also felt lightheaded, so he started taking losartan in pm, which has helped some. Does not occur with standing quickly or moving head back and forth. Right and left sided chest pressure 1/10. Seems to be a little better when he first wakes up. He felt shaky at gym prior to stress test when squatting. No edema, orthopnea, or PND. He does snore. Had a partial sleep study, but did not finish it. BP was 190/110. SBP typically 140-150/90s. Hemoglobin high at PCP office last week.   Came back and saw Dr. Nyoka Cowden and had ETT which showed LVH 26mm ST depression. Denied CP during stress test. But chest got tight in recovery. Referred for Nuke study.  Myoview yesterday with 58mm ST depression but perfusion images ok. EF 56%,   SH: No tobacco. Drinks ~1 drink/day. No drug use.   FH: dad MI at 55, mother with afib SCD at 28  05/16/18 Nuclear Stress Test:   The left ventricular ejection fraction is normal (55-65%).  Nuclear stress EF: 57%.  Blood pressure demonstrated a hypertensive response to exercise.  ST segment depression was noted during stress in the II, III, aVF and V6 leads.  The study is normal.  This is a low risk study.   Normal low risk stress nuclear study with no ischemia or infarction.  Gated ejection fraction 57% with normal wall motion.  Note patient did have 2 mm of ST depression in inferior lateral leads with peak exertion.  Review of Systems: [y] = yes, [  ] = no   General: Weight gain [ ] ; Weight loss [ ] ; Anorexia [ ] ; Fatigue [ ] ; Fever [ ] ; Chills [ ] ; Weakness [ ]   Cardiac: Chest pain/pressure Blue.Reese ]; Resting SOB [ ] ; Exertional SOB [ ] ; Orthopnea [ ] ; Pedal Edema [ ] ; Palpitations [ ] ; Syncope [ ] ; Presyncope [ ] ; Paroxysmal nocturnal dyspnea[ ]   Pulmonary: Cough [ ] ; Wheezing[ ] ; Hemoptysis[ ] ; Sputum [ ] ; Snoring [ ]   GI: Vomiting[ ] ; Dysphagia[ ] ; Melena[ ] ; Hematochezia [ ] ; Heartburn[ ] ; Abdominal pain [ ] ; Constipation [ ] ; Diarrhea [ ] ; BRBPR [ ]   GU: Hematuria[ ] ; Dysuria [ ] ; Nocturia[ ]   Vascular: Pain in legs with walking [ ] ; Pain in feet with lying flat [ ] ; Non-healing sores [ ] ; Stroke [ ] ; TIA [ ] ; Slurred speech [ ] ;  Neuro: Headaches[ ] ; Vertigo[ ] ; Seizures[ ] ; Paresthesias[ ] ;Blurred vision [ ] ; Diplopia [ ] ; Vision changes [ ]   Ortho/Skin: Arthritis Blue.Reese ]; Joint pain [ ] ; Muscle pain [ ] ; Joint swelling [ ] ; Back Pain [ ] ; Rash [ ]   Psych: Depression[ ] ; Anxiety[ ]   Heme: Bleeding problems [ ] ; Clotting disorders [ ] ; Anemia [ ]   Endocrine: Diabetes [ ] ; Thyroid dysfunction[ ]    Past Medical History:  Diagnosis Date  . Allergy   . Arthritis   . ECRB (extensor carpi radialis brevis) tenosynovitis   .  GERD (gastroesophageal reflux disease)    occ  . Lateral epicondylitis of right elbow   . Medial epicondylitis of right elbow   . PONV (postoperative nausea and vomiting)   . Wears glasses     Current Outpatient Medications  Medication Sig Dispense Refill  . losartan (COZAAR) 100 MG tablet Take 100 mg by mouth daily.    Marland Kitchen omeprazole (PRILOSEC) 20 MG capsule Take 20 mg by mouth 2 (two) times daily before a meal.     Current Facility-Administered Medications  Medication Dose Route Frequency Provider Last Rate Last Dose  . 0.9 %  sodium chloride infusion  500 mL Intravenous Continuous Doran Stabler, MD        Allergies  Allergen Reactions  . Codeine     REACTION: itching  . Yellow Jacket Venom [Bee Venom]  Hives and Itching      Social History   Socioeconomic History  . Marital status: Married    Spouse name: Not on file  . Number of children: Not on file  . Years of education: Not on file  . Highest education level: Not on file  Occupational History  . Not on file  Social Needs  . Financial resource strain: Not on file  . Food insecurity:    Worry: Not on file    Inability: Not on file  . Transportation needs:    Medical: Not on file    Non-medical: Not on file  Tobacco Use  . Smoking status: Never Smoker  . Smokeless tobacco: Never Used  Substance and Sexual Activity  . Alcohol use: Yes    Comment: daily 2 drinks  . Drug use: No  . Sexual activity: Yes    Birth control/protection: None  Lifestyle  . Physical activity:    Days per week: Not on file    Minutes per session: Not on file  . Stress: Not on file  Relationships  . Social connections:    Talks on phone: Not on file    Gets together: Not on file    Attends religious service: Not on file    Active member of club or organization: Not on file    Attends meetings of clubs or organizations: Not on file    Relationship status: Not on file  . Intimate partner violence:    Fear of current or ex partner: Not on file    Emotionally abused: Not on file    Physically abused: Not on file    Forced sexual activity: Not on file  Other Topics Concern  . Not on file  Social History Narrative  . Not on file     No family history on file.  Vitals:   05/22/18 1352  BP: (!) 143/90  Pulse: 62  SpO2: 99%  Weight: 100.7 kg (222 lb)     PHYSICAL EXAM: General:  Well appearing. No respiratory difficulty HEENT: normal Neck: supple. no JVD. Carotids 2+ bilat; no bruits. No lymphadenopathy or thyromegaly appreciated. Cor: PMI nondisplaced. Regular rate & rhythm. No rubs, gallops or murmurs. Lungs: clear Abdomen: obese, soft, nontender, nondistended. No hepatosplenomegaly. No bruits or masses. Good bowel  sounds. Extremities: no cyanosis, clubbing, rash, edema Neuro: alert & oriented x 3, cranial nerves grossly intact. moves all 4 extremities w/o difficulty. Affect pleasant.  ECG: NSR 60 bpm.No ST-T wave abnormalities.  Personally reviewed.    ASSESSMENT & PLAN:  1. Chest Pain - Low risk stress test 9/17 with EF 55-60%. He had 2 mm  ST depression in II, III, aVF, and V6 with peak exertion  2. HTN - Continue losartan 100 mg daily - BP elevated today.    Georgiana Shore, NP 05/22/18  Patient seen and examined with the above-signed Advanced Practice Provider and/or Housestaff. I personally reviewed laboratory data, imaging studies and relevant notes. I independently examined the patient and formulated the important aspects of the plan. I have edited the note to reflect any of my changes or salient points. I have personally discussed the plan with the patient and/or family.  He has chest discomfort with typical and atypical features. Results of work-up to date have been equivocal with positive stress ECG but normal Myoview images. I have reviewed both tests personally.Given nature of symptoms and significant positive exercise ECG, I think we need a more definitive answer here.We discussed cardiac cath versus coronary CT angiography. Will proceed with CT.   Glori Bickers, MD  10:08 PM

## 2018-05-22 NOTE — Patient Instructions (Signed)
Your provider has requested you have a Cardiac CTA. (we will call you to schedule appointment once insurance approves procedure)  Follow up with Dr.Bensimhon as needed.

## 2018-06-11 ENCOUNTER — Encounter: Payer: 59 | Admitting: *Deleted

## 2018-06-11 ENCOUNTER — Ambulatory Visit (HOSPITAL_COMMUNITY)
Admission: RE | Admit: 2018-06-11 | Discharge: 2018-06-11 | Disposition: A | Discharge status: Home / Self Care | Payer: 59 | Source: Ambulatory Visit | Attending: Internal Medicine | Admitting: Internal Medicine

## 2018-06-11 DIAGNOSIS — R918 Other nonspecific abnormal finding of lung field: Secondary | ICD-10-CM | POA: Diagnosis not present

## 2018-06-11 DIAGNOSIS — R0789 Other chest pain: Secondary | ICD-10-CM | POA: Insufficient documentation

## 2018-06-11 DIAGNOSIS — Z006 Encounter for examination for normal comparison and control in clinical research program: Secondary | ICD-10-CM

## 2018-06-11 MED ORDER — NITROGLYCERIN 0.4 MG SL SUBL
SUBLINGUAL_TABLET | SUBLINGUAL | Status: AC
  Administered 2018-06-11: 0.8 mg
  Filled 2018-06-11: qty 1

## 2018-06-11 MED ORDER — IOPAMIDOL (ISOVUE-370) INJECTION 76%
100.0000 mL | Freq: Once | INTRAVENOUS | Status: AC | PRN
  Administered 2018-06-11: 80 mL via INTRAVENOUS

## 2018-06-11 MED ORDER — METOPROLOL TARTRATE 5 MG/5ML IV SOLN
INTRAVENOUS | Status: AC
  Administered 2018-06-11: 5 mg
  Administered 2018-06-11: 15:00:00
  Filled 2018-06-11: qty 20

## 2018-06-11 NOTE — Research (Signed)
CADFEM Informed Consent           Subject Name:  Andrew Nolan   Subject met inclusion and exclusion criteria.  The informed consent form, study requirements and expectations were reviewed with the subject and questions and concerns were addressed prior to the signing of the consent form.  The subject verbalized understanding of the trial requirements.  The subject agreed to participate in the CADFEM trial and signed the informed consent.  The informed consent was obtained prior to performance of any protocol-specific procedures for the subject.  A copy of the signed informed consent was given to the subject and a copy was placed in the subject's medical record.   [place] Akeem Heppler, Research Assistant 06/11/2018  13:38 P.M.

## 2018-06-12 NOTE — Research (Signed)
CADFEM visit completed

## 2019-06-11 ENCOUNTER — Encounter: Payer: Self-pay | Admitting: Gastroenterology

## 2019-06-26 ENCOUNTER — Other Ambulatory Visit: Payer: Self-pay

## 2019-06-26 ENCOUNTER — Encounter: Payer: Self-pay | Admitting: Gastroenterology

## 2019-06-26 ENCOUNTER — Ambulatory Visit (AMBULATORY_SURGERY_CENTER): Payer: 59 | Admitting: *Deleted

## 2019-06-26 VITALS — Temp 97.1°F | Ht 71.0 in | Wt 208.0 lb

## 2019-06-26 DIAGNOSIS — Z1211 Encounter for screening for malignant neoplasm of colon: Secondary | ICD-10-CM

## 2019-06-26 DIAGNOSIS — Z1159 Encounter for screening for other viral diseases: Secondary | ICD-10-CM

## 2019-06-26 MED ORDER — SUPREP BOWEL PREP KIT 17.5-3.13-1.6 GM/177ML PO SOLN: 1.0000 | Freq: Once | ORAL | 0 refills | Status: AC

## 2019-06-26 NOTE — Progress Notes (Signed)
Patient denies any allergies to egg or soy products. Patient had PONV only with one surgery in 2005. No other problems with anesthesia with the other surgical procedures.  Patient denies oxygen use at home and denies diet medications. Emmi instructions for colonoscopy/endoscopy explained and given to patient.  Suprep coupon given.

## 2019-07-07 ENCOUNTER — Other Ambulatory Visit: Payer: Self-pay | Admitting: Gastroenterology

## 2019-07-07 LAB — SARS CORONAVIRUS 2 (TAT 6-24 HRS): SARS Coronavirus 2: NEGATIVE

## 2019-07-10 ENCOUNTER — Encounter: Payer: Self-pay | Admitting: Gastroenterology

## 2019-07-10 ENCOUNTER — Other Ambulatory Visit: Payer: Self-pay

## 2019-07-10 ENCOUNTER — Ambulatory Visit (AMBULATORY_SURGERY_CENTER): Payer: 59 | Admitting: Gastroenterology

## 2019-07-10 VITALS — BP 108/58 | HR 64 | Temp 98.2°F | Resp 12 | Ht 71.0 in | Wt 208.0 lb

## 2019-07-10 DIAGNOSIS — Z1211 Encounter for screening for malignant neoplasm of colon: Secondary | ICD-10-CM | POA: Diagnosis not present

## 2019-07-10 DIAGNOSIS — D122 Benign neoplasm of ascending colon: Secondary | ICD-10-CM | POA: Diagnosis not present

## 2019-07-10 DIAGNOSIS — D120 Benign neoplasm of cecum: Secondary | ICD-10-CM | POA: Diagnosis not present

## 2019-07-10 DIAGNOSIS — K635 Polyp of colon: Secondary | ICD-10-CM | POA: Diagnosis not present

## 2019-07-10 MED ORDER — SODIUM CHLORIDE 0.9 % IV SOLN: 500.0000 mL | INTRAVENOUS | Status: DC

## 2019-07-10 NOTE — Progress Notes (Signed)
Report given to PACU, vss 

## 2019-07-10 NOTE — Op Note (Addendum)
[facility] Patient Name: Andrew Nolan Procedure Date: 07/10/2019 8:21 AM MRN: [id number] Endoscopist: Mallie Mussel L. [name] , MD Age: 58 Referring MD:  Date of Birth: [birth year] Gender: Male Account #: [id number] Procedure:                Colonoscopy Indications:              Screening for colorectal malignant neoplasm (last                            colonoscopy 06/2009) Medicines:                Monitored Anesthesia Care Procedure:                Pre-Anesthesia Assessment:                           - Prior to the procedure, a History and Physical                            was performed, and patient medications and                            allergies were reviewed. The patient's tolerance of                            previous anesthesia was also reviewed. The risks                            and benefits of the procedure and the sedation                            options and risks were discussed with the patient.                            All questions were answered, and informed consent                            was obtained. Prior Anticoagulants: The patient has                            taken no previous anticoagulant or antiplatelet                            agents. ASA Grade Assessment: II - A patient with                            mild systemic disease. After reviewing the risks                            and benefits, the patient was deemed in                            satisfactory condition to undergo the procedure.  After obtaining informed consent, the colonoscope                            was passed under direct vision. Throughout the                            procedure, the patient's blood pressure, pulse, and                            oxygen saturations were monitored continuously. The                            Colonoscope was introduced through the anus and                            advanced to the the cecum, identified by                          appendiceal orifice and ileocecal valve. The                            colonoscopy was performed without difficulty. The                            patient tolerated the procedure well. The quality                            of the bowel preparation was initially fair, then                            improved to good with lavage. The ileocecal valve,                            appendiceal orifice, and rectum were photographed.                            The bowel preparation used was SUPREP. Scope In: 8:27:01 AM Scope Out: 8:45:29 AM Scope Withdrawal Time: 0 hours 16 minutes 49 seconds  Total Procedure Duration: 0 hours 18 minutes 28 seconds  Findings:                 The perianal and digital rectal examinations were                            normal.                           A 5 mm polyp was found in the cecum. The polyp was                            sessile. The polyp was removed with a cold snare.                            Resection and retrieval were complete.  A 5 mm polyp was found in the ascending colon. The                            polyp was pedunculated. The polyp was removed with                            a hot snare. Resection and retrieval were complete.                           The exam was otherwise without abnormality on                            direct and retroflexion views. Complications:            No immediate complications. Estimated Blood Loss:     Estimated blood loss was minimal. Impression:               - One 5 mm polyp in the cecum, removed with a cold                            snare. Resected and retrieved.                           - One 5 mm polyp in the ascending colon, removed                            with a hot snare. Resected and retrieved.                           - The examination was otherwise normal on direct                            and retroflexion views. Recommendation:           -  Patient has a contact number available for                            emergencies. The signs and symptoms of potential                            delayed complications were discussed with the                            patient. Return to normal activities tomorrow.                            Written discharge instructions were provided to the                            patient.                           - Resume previous diet.                           -  Continue present medications.                           - Await pathology results.                           - Repeat colonoscopy is recommended for                            surveillance. The colonoscopy date will be                            determined after pathology results from today's                            exam become available for review.                           For next exam, consume more fluid after prep                            solution and start AM dose earlier for better                            preparation. Henry L. [name], MD 07/10/2019 8:52:10 AM This report has been signed electronically.

## 2019-07-10 NOTE — Progress Notes (Signed)
Called to room to assist during endoscopic procedure.  Patient ID and intended procedure confirmed with present staff. Received instructions for my participation in the procedure from the performing physician.  

## 2019-07-10 NOTE — Patient Instructions (Signed)
Thank you for letting us take care of your healthcare needs today. Please see handouts given to you on Polyps.    YOU HAD AN ENDOSCOPIC PROCEDURE TODAY AT [facility]:   Refer to the procedure report that was given to you for any specific questions about what was found during the examination.  If the procedure report does not answer your questions, please call your gastroenterologist to clarify.  If you requested that your care partner not be given the details of your procedure findings, then the procedure report has been included in a sealed envelope for you to review at your convenience later.  YOU SHOULD EXPECT: Some feelings of bloating in the abdomen. Passage of more gas than usual.  Walking can help get rid of the air that was put into your GI tract during the procedure and reduce the bloating. If you had a lower endoscopy (such as a colonoscopy or flexible sigmoidoscopy) you may notice spotting of blood in your stool or on the toilet paper. If you underwent a bowel prep for your procedure, you may not have a normal bowel movement for a few days.  Please Note:  You might notice some irritation and congestion in your nose or some drainage.  This is from the oxygen used during your procedure.  There is no need for concern and it should clear up in a day or so.  SYMPTOMS TO REPORT IMMEDIATELY:   Following lower endoscopy (colonoscopy or flexible sigmoidoscopy):  Excessive amounts of blood in the stool  Significant tenderness or worsening of abdominal pains  Swelling of the abdomen that is new, acute  Fever of 100F or higher   For urgent or emergent issues, a gastroenterologist can be reached at any hour by calling [phone].   DIET:  We do recommend a small meal at first, but then you may proceed to your regular diet.  Drink plenty of fluids but you should avoid alcoholic beverages for 24 hours.  ACTIVITY:  You should plan to take it easy for the rest of today and  you should NOT DRIVE or use heavy machinery until tomorrow (because of the sedation medicines used during the test).    FOLLOW UP: Our staff will call the number listed on your records 48-72 hours following your procedure to check on you and address any questions or concerns that you may have regarding the information given to you following your procedure. If we do not reach you, we will leave a message.  We will attempt to reach you two times.  During this call, we will ask if you have developed any symptoms of COVID 19. If you develop any symptoms (ie: fever, flu-like symptoms, shortness of breath, cough etc.) before then, please call [phone].  If you test positive for Covid 19 in the 2 weeks post procedure, please call and report this information to [place].    If any biopsies were taken you will be contacted by phone or by letter within the next 1-3 weeks.  Please call us at [phone] if you have not heard about the biopsies in 3 weeks.    SIGNATURES/CONFIDENTIALITY: You and/or your care partner have signed paperwork which will be entered into your electronic medical record.  These signatures attest to the fact that that the information above on your After Visit Summary has been reviewed and is understood.  Full responsibility of the confidentiality of this discharge information lies with you and/or your care-partner.

## 2019-07-10 NOTE — Progress Notes (Signed)
Temp JB V/S Baca  I have reviewed the patient's medical history in detail and updated the computerized patient record.

## 2019-07-14 ENCOUNTER — Telehealth: Payer: Self-pay | Admitting: *Deleted

## 2019-07-14 ENCOUNTER — Encounter: Payer: Self-pay | Admitting: Gastroenterology

## 2019-07-14 ENCOUNTER — Telehealth: Payer: Self-pay

## 2019-07-14 NOTE — Telephone Encounter (Signed)
Second post procedure follow up call, left message 

## 2019-07-14 NOTE — Telephone Encounter (Signed)
First follow up call attempt.  Message left on voicemail. 

## 2019-11-05 ENCOUNTER — Ambulatory Visit: Payer: 59 | Attending: Neurosurgery | Admitting: Physical Therapy

## 2019-11-05 ENCOUNTER — Other Ambulatory Visit: Payer: Self-pay

## 2019-11-05 ENCOUNTER — Encounter: Payer: Self-pay | Admitting: Physical Therapy

## 2019-11-05 DIAGNOSIS — M5412 Radiculopathy, cervical region: Secondary | ICD-10-CM | POA: Insufficient documentation

## 2019-11-05 NOTE — Therapy (Signed)
[facility] [address], [place], [place], [address] Phone: [phone]   Fax:  [phone]  Physical Therapy Evaluation  Patient Details  Name: Andrew Nolan MRN: [id number] Date of Birth: March 13, 1961 Referring Provider (PT): Jovita Gamma MD   Encounter Date: 11/05/2019  PT End of Session - 11/05/19 0803    Visit Number  1    Number of Visits  8    Date for PT Re-Evaluation  12/03/19    PT Start Time  0803    PT Stop Time  0841    PT Time Calculation (min)  38 min    Activity Tolerance  Patient tolerated treatment well    Behavior During Therapy  [facility] for tasks assessed/performed       Past Medical History:  Diagnosis Date  . Allergy   . Arthritis   . ECRB (extensor carpi radialis brevis) tenosynovitis   . GERD (gastroesophageal reflux disease)    occasional   . Hyperlipidemia   . Hypertension   . Lateral epicondylitis of right elbow   . Medial epicondylitis of right elbow   . PONV (postoperative nausea and vomiting)    only with one surgery in 2005, no other problems with other surgeries  . Shingles   . Wears glasses     Past Surgical History:  Procedure Laterality Date  . COLONOSCOPY  06/17/2009   dr Deatra Ina   . FRACTURE SURGERY     lt thumb-age 76  . LATERAL EPICONDYLE RELEASE Right 11/11/2013   Procedure: RIGHT TENOTOMY ELBOW LATERAL EPICONDYLITIS TENNIS ELBOW/RIGHT ELBOW INJECTION ASPIRATION ARTHROCENTESIS INTERMEDULLARY JOINT BURSA;  Surgeon: Lorn Junes, MD;  Location: [facility];  Service: Orthopedics;  Laterality: Right;  . ORIF METACARPAL FRACTURE  1982   right hand  . ORIF [place]   compound-right  . ORIF WRIST FRACTURE  1975   left  . SHOULDER ARTHROSCOPY W/ LABRAL REPAIR  1999   right  . SHOULDER ARTHROSCOPY W/ ROTATOR CUFF REPAIR  2005   left  . UPPER GI ENDOSCOPY    . VASECTOMY    . WISDOM TOOTH EXTRACTION      There were no vitals  filed for this visit.   Subjective Assessment - 11/05/19 0803    Subjective  Patient ruptured cervical disc 15 yrs ago. Injections worked great. In 2019 he started having sx again, good results with injections again. Sx returned and he had an Injection 6 wks ago which did not work. He had another injection yesterday. He has 3 discs with issues and MD wants to do a fusion. He has pain with neutral spine into Rt UT area. Best relief with looking down. Varying sx down the arm including tingling.    Pertinent History  Rt wrist torn cartilage, bil RCR, left knee and right elbow surgeries    Diagnostic tests  MRI    Patient Stated Goals  to avoid surgery    Currently in Pain?  Yes    Pain Score  2     Pain Location  Neck    Pain Orientation  Right    Pain Descriptors / Indicators  Sore    Pain Type  Chronic pain    Pain Radiating Towards  tingling down past forearm    Pain Onset  More than a month ago    Pain Frequency  Intermittent    Aggravating Factors   poor posture    Pain Relieving  Factors  flexion         [facility] PT Assessment - 11/05/19 0001      Assessment   Medical Diagnosis  cervical spondylosis with radiculopathy and cervicalgia    Referring Provider (PT)  Jovita Gamma MD    Onset Date/Surgical Date  09/19/19    Hand Dominance  Right    Next MD Visit  4 weeks    Prior Therapy  yes - traction helped      Precautions   Precautions  None      Restrictions   Weight Bearing Restrictions  No      Balance Screen   Has the patient fallen in the past 6 months  No    Has the patient had a decrease in activity level because of a fear of falling?   No    Is the patient reluctant to leave their home because of a fear of falling?   No      Home Film/video editor residence      Prior Function   Level of [place]  Retired      Observation/Other Assessments   Focus on Therapeutic Outcomes (FOTO)   40% limited       Posture/Postural Control   Posture Comments  forward head      ROM / Strength   AROM / PROM / Strength  AROM;Strength      AROM   AROM Assessment Site  Cervical    Cervical Flexion  --   full   Cervical Extension  20    Cervical - Right Side Bend  30    Cervical - Left Side Bend  45    Cervical - Right Rotation  65    Cervical - Left Rotation  65      Strength   Overall Strength Comments  right shoulder 5/5 except ER 4+/5. Pain with ABD    Strength Assessment Site  Cervical    Cervical Flexion  4+/5    Cervical Extension  5/5    Cervical - Right Side Bend  5/5    Cervical - Left Side Bend  4+/5    Cervical - Right Rotation  5/5    Cervical - Left Rotation  4+/5      Palpation   Palpation comment  cervical paraspinals, Rt UT  and medial scapular      Special Tests    Special Tests  Cervical    Other special tests  ULTT +median and radial    Cervical Tests  Dictraction;Spurling's      Spurling's   Findings  Negative    Comment  bil      Distraction Test   Findngs  Positive    side  Right    Comment  relief noted                Objective measurements completed on examination: See above findings.              PT Education - 11/05/19 1551    Education Details  HEP; DN education    Person(s) Educated  Patient    Methods  Explanation;Demonstration;Handout    Comprehension  Verbalized understanding;Returned demonstration       PT Short Term Goals - 11/05/19 1606      PT SHORT TERM GOAL #1   Title  Ind with initial HEP    Time  2    Period  Weeks    Status  New    Target Date  11/19/19        PT Long Term Goals - 11/05/19 1606      PT LONG TERM GOAL #1   Title  Patient to report decreased incidence of RUE sx by 80% or more.    Time  4    Period  Weeks    Status  New    Target Date  12/03/19      PT LONG TERM GOAL #2   Title  Patient to report decrease in neck pain by 50% or more with ADLs.    Time  4    Period  Weeks    Status   New      PT LONG TERM GOAL #3   Title  improved cervcial strength to 5/5.    Time  4    Period  Weeks    Status  New             Plan - 11/05/19 1551    Clinical Impression Statement  Patient presents with a long history of Rt cervical radiculopathy which has exacerbated again in January 2021. Injections did help this time. He would like to avoid surgery. He presently has pain in the neck and intermittent tingling in the RUE to just past his elbow. Pain is the worst when coming out of flexion to neutral. Patient has functional neck ROM and good BUE strength. He does have positive ULTT of the median and radial nerve on the right and weakness in his cervical stabilizers. He also has increased tone and tension in the right UQ and neck. He has had relief with traction in the past and felt some relief with manual traction today. We did not do traction today as patient had another injection yesterday. He will benefit from PT to decrease his pain and UE sx to hopefully avoid surgery.    Personal Factors and Comorbidities  Comorbidity 3+    Comorbidities  Rt wrist torn cartilage, bil RCR, left knee and right elbow surgeries    Stability/Clinical Decision Making  Stable/Uncomplicated    Clinical Decision Making  Low    Rehab Potential  Good    PT Frequency  2x / week    PT Duration  4 weeks    PT Treatment/Interventions  ADLs/Self Care Home Management;Cryotherapy;Electrical Stimulation;Moist Heat;Traction;Therapeutic exercise;Neuromuscular re-education;Patient/family education;Manual techniques;Dry needling;Taping;Spinal Manipulations    PT Next Visit Plan  DN to Right UQ, nerve glides median and radial, cervical stab, traction    PT Home Exercise Plan  [email or web address]       Patient will benefit from skilled therapeutic intervention in order to improve the following deficits and impairments:  Decreased range of motion, Increased muscle spasms, Pain, Decreased strength, Postural dysfunction, Impaired  flexibility  Visit Diagnosis: Radiculopathy, cervical region - Plan: PT plan of care cert/re-cert     Problem List Patient Active Problem List   Diagnosis Date Noted  . ECRB (extensor carpi radialis brevis) tenosynovitis   . Medial epicondylitis of right elbow   . Lateral epicondylitis of right elbow    [name] PT 11/05/2019, 4:17 PM  [facility] [address], [place], [place], [address] Phone: [phone]   Fax:  [phone]  Name: Andrew Nolan MRN: [id number] Date of Birth: 1960-09-14

## 2019-11-05 NOTE — Patient Instructions (Signed)
Access Code: [email or web address]  URL: [email or web address]  Date: 11/05/2019  Prepared by: [name]   Exercises Seated Scapular Retraction - 10 reps - 3 sets - 1x daily - 7x weekly Seated Cervical Retraction - 10 reps - 3 sets - 1x daily - 7x weekly Patient Education Trigger Point Dry Needling

## 2019-11-10 ENCOUNTER — Encounter: Payer: Self-pay | Admitting: Physical Therapy

## 2019-11-10 ENCOUNTER — Ambulatory Visit: Payer: 59 | Admitting: Physical Therapy

## 2019-11-10 ENCOUNTER — Other Ambulatory Visit: Payer: Self-pay

## 2019-11-10 DIAGNOSIS — M5412 Radiculopathy, cervical region: Secondary | ICD-10-CM

## 2019-11-10 NOTE — Therapy (Signed)
[facility] [address] [place], [place], [address] Phone: [phone]   Fax:  [phone]  Physical Therapy Treatment  Patient Details  Name: Andrew Nolan MRN: [id number] Date of Birth: Jul 21, 1961 Referring Provider (PT): Jovita Gamma MD   Encounter Date: 11/10/2019  PT End of Session - 11/10/19 1039    Visit Number  2    Number of Visits  8    Date for PT Re-Evaluation  12/03/19    PT Start Time  1030    PT Stop Time  1100    PT Time Calculation (min)  30 min    Activity Tolerance  Patient tolerated treatment well    Behavior During Therapy  [facility] for tasks assessed/performed       Past Medical History:  Diagnosis Date  . Allergy   . Arthritis   . ECRB (extensor carpi radialis brevis) tenosynovitis   . GERD (gastroesophageal reflux disease)    occasional   . Hyperlipidemia   . Hypertension   . Lateral epicondylitis of right elbow   . Medial epicondylitis of right elbow   . PONV (postoperative nausea and vomiting)    only with one surgery in 2005, no other problems with other surgeries  . Shingles   . Wears glasses     Past Surgical History:  Procedure Laterality Date  . COLONOSCOPY  06/17/2009   dr Deatra Ina   . FRACTURE SURGERY     lt thumb-age 22  . LATERAL EPICONDYLE RELEASE Right 11/11/2013   Procedure: RIGHT TENOTOMY ELBOW LATERAL EPICONDYLITIS TENNIS ELBOW/RIGHT ELBOW INJECTION ASPIRATION ARTHROCENTESIS INTERMEDULLARY JOINT BURSA;  Surgeon: Lorn Junes, MD;  Location: [facility];  Service: Orthopedics;  Laterality: Right;  . ORIF METACARPAL FRACTURE  1982   right hand  . ORIF [place]   compound-right  . ORIF WRIST FRACTURE  1975   left  . SHOULDER ARTHROSCOPY W/ LABRAL REPAIR  1999   right  . SHOULDER ARTHROSCOPY W/ ROTATOR CUFF REPAIR  2005   left  . UPPER GI ENDOSCOPY    . VASECTOMY    . WISDOM TOOTH EXTRACTION      There were no vitals filed  for this visit.  Subjective Assessment - 11/10/19 1034    Subjective  Pt arriving to therapy15 minutes late reporting he is ready for needling. We discussed DN for next visit with [name], PT. Pt arriving today reporting 2/10 pain. Pt reports it's still pretty early and pain tends to worsened.    Pertinent History  Rt wrist torn cartilage, bil RCR, left knee and right elbow surgeries    Diagnostic tests  MRI    Patient Stated Goals  to avoid surgery    Currently in Pain?  Yes    Pain Score  2     Pain Location  Neck    Pain Orientation  Right    Pain Descriptors / Indicators  Sore    Pain Type  Chronic pain    Pain Onset  More than a month ago                       [facility] Adult PT Treatment/Exercise - 11/10/19 0001      Exercises   Exercises  Neck      Neck Exercises: Machines for Strengthening   UBE (Upper Arm Bike)  2.5 minutes forward and back L1.5    [employer]  15# 15 reps x 2, 15#, D1 following with neck       Modalities   Modalities  Traction      Traction   Type of Traction  Cervical    Min (lbs)  10    Max (lbs)  15    Hold Time  60 seconds    Rest Time  20 seconds    Time  15 minutes             PT Education - 11/10/19 1038    Education Details  discussed DN for next visit.    Person(s) Educated  Patient    Methods  Explanation    Comprehension  Verbalized understanding       PT Short Term Goals - 11/10/19 1059      PT SHORT TERM GOAL #1   Title  Ind with initial HEP    Time  2    Period  Weeks    Status  On-going    Target Date  11/19/19        PT Long Term Goals - 11/05/19 1606      PT LONG TERM GOAL #1   Title  Patient to report decreased incidence of RUE sx by 80% or more.    Time  4    Period  Weeks    Status  New    Target Date  12/03/19      PT LONG TERM GOAL #2   Title  Patient to report decrease in neck pain by 50% or more with ADLs.    Time  4    Period  Weeks    Status  New      PT LONG TERM  GOAL #3   Title  improved cervcial strength to 5/5.    Time  4    Period  Weeks    Status  New            Plan - 11/10/19 1057    Clinical Impression Statement  Pt arriving to therapy 15 minutes late, Pt reporting mild sorness in R shoulder/upper trap with D1 diagnals. Mechanical Traction performed with good tolerance. Discussed DN at next visit. Conintue skilled PT.    Personal Factors and Comorbidities  Comorbidity 3+    Comorbidities  Rt wrist torn cartilage, bil RCR, left knee and right elbow surgeries    Stability/Clinical Decision Making  Stable/Uncomplicated    Rehab Potential  Good    PT Frequency  2x / week    PT Duration  4 weeks    PT Treatment/Interventions  ADLs/Self Care Home Management;Cryotherapy;Electrical Stimulation;Moist Heat;Traction;Therapeutic exercise;Neuromuscular re-education;Patient/family education;Manual techniques;Dry needling;Taping;Spinal Manipulations    PT Next Visit Plan  DN to Right UQ, nerve glides median and radial, cervical stab, traction    PT Home Exercise Plan  [id number]       Patient will benefit from skilled therapeutic intervention in order to improve the following deficits and impairments:     Visit Diagnosis: Radiculopathy, cervical region     Problem List Patient Active Problem List   Diagnosis Date Noted  . ECRB (extensor carpi radialis brevis) tenosynovitis   . Medial epicondylitis of right elbow   . Lateral epicondylitis of right elbow     Andrew Nolan , PT 11/10/2019, 11:01 AM  [facility] [address], [place], [place], [address] Phone: [phone]   Fax:  [phone]  Name: Andrew Nolan MRN: [id number] Date of Birth: 11-19-1960

## 2019-11-12 ENCOUNTER — Other Ambulatory Visit: Payer: Self-pay

## 2019-11-12 ENCOUNTER — Ambulatory Visit: Payer: 59 | Admitting: Physical Therapy

## 2019-11-12 ENCOUNTER — Encounter: Payer: Self-pay | Admitting: Physical Therapy

## 2019-11-12 DIAGNOSIS — M5412 Radiculopathy, cervical region: Secondary | ICD-10-CM

## 2019-11-12 NOTE — Patient Instructions (Signed)
Access Code: [email or web address] URL: [email or web address] Date: 11/12/2019 Prepared by: Almyra Free Danyah Guastella  Exercises Seated Scapular Retraction - 10 reps - 3 sets - 1x daily - 7x weekly Seated Cervical Retraction - 10 reps - 3 sets - 1x daily - 7x weekly Radial Nerve Flossing - 10 reps - 2 sets - 1x daily - 7x weekly Doorway Pec Stretch at 90 Degrees Abduction - 3 reps - 1 sets - 30-60 seconds hold - 1x daily - 7x weekly Standing Median Nerve Glide - 10 reps - 2 sets - 1x daily - 7x weekly Patient Education Trigger Point Dry Needling

## 2019-11-12 NOTE — Therapy (Signed)
[facility] [address] [place], [place], [address] Phone: [phone]   Fax:  [phone]  Physical Therapy Treatment  Patient Details  Name: Andrew Nolan MRN: [id number] Date of Birth: 1960/12/07 Referring Provider (PT): Jovita Gamma MD   Encounter Date: 11/12/2019  PT End of Session - 11/12/19 1446    Visit Number  3    Number of Visits  8    Date for PT Re-Evaluation  12/03/19    PT Start Time  [id number]    PT Stop Time  1532    PT Time Calculation (min)  46 min    Activity Tolerance  Patient tolerated treatment well    Behavior During Therapy  [facility] for tasks assessed/performed       Past Medical History:  Diagnosis Date  . Allergy   . Arthritis   . ECRB (extensor carpi radialis brevis) tenosynovitis   . GERD (gastroesophageal reflux disease)    occasional   . Hyperlipidemia   . Hypertension   . Lateral epicondylitis of right elbow   . Medial epicondylitis of right elbow   . PONV (postoperative nausea and vomiting)    only with one surgery in 2005, no other problems with other surgeries  . Shingles   . Wears glasses     Past Surgical History:  Procedure Laterality Date  . COLONOSCOPY  06/17/2009   dr Deatra Ina   . FRACTURE SURGERY     lt thumb-age 59  . LATERAL EPICONDYLE RELEASE Right 11/11/2013   Procedure: RIGHT TENOTOMY ELBOW LATERAL EPICONDYLITIS TENNIS ELBOW/RIGHT ELBOW INJECTION ASPIRATION ARTHROCENTESIS INTERMEDULLARY JOINT BURSA;  Surgeon: Lorn Junes, MD;  Location: [facility];  Service: Orthopedics;  Laterality: Right;  . ORIF METACARPAL FRACTURE  1982   right hand  . ORIF [place]   compound-right  . ORIF WRIST FRACTURE  1975   left  . SHOULDER ARTHROSCOPY W/ LABRAL REPAIR  1999   right  . SHOULDER ARTHROSCOPY W/ ROTATOR CUFF REPAIR  2005   left  . UPPER GI ENDOSCOPY    . VASECTOMY    . WISDOM TOOTH EXTRACTION      There were no vitals  filed for this visit.  Subjective Assessment - 11/12/19 1447    Subjective  Patient reports no pain today.                       [facility] Adult PT Treatment/Exercise - 11/12/19 0001      Neck Exercises: Seated   Neck Retraction  10 reps    Other Seated Exercise  scapular retraction x 10 with cues to keep shoulders down    Other Seated Exercise  median and radial nerve glides x 10; various methods tried and issued for HEP      Manual Therapy   Manual Therapy  Soft tissue mobilization    Manual therapy comments  Skilled palpation and monitoring of soft tissues during DN     Soft tissue mobilization  IASTM to ant/post RT upper arm; STW to bil UT and cspine      Neck Exercises: Stretches   Chest Stretch  1 rep;30 seconds   doorway 90/90      Trigger Point Dry Needling - 11/12/19 0001    Consent Given?  Yes    Education Handout Provided  Previously provided    Muscles Treated Head and Neck  Upper trapezius;Cervical multifidi    Dry  Needling Comments  right    Upper Trapezius Response  Twitch reponse elicited;Palpable increased muscle length    Cervical multifidi Response  Palpable increased muscle length             PT Short Term Goals - 11/10/19 1059      PT SHORT TERM GOAL #1   Title  Ind with initial HEP    Time  2    Period  Weeks    Status  On-going    Target Date  11/19/19        PT Long Term Goals - 11/05/19 1606      PT LONG TERM GOAL #1   Title  Patient to report decreased incidence of RUE sx by 80% or more.    Time  4    Period  Weeks    Status  New    Target Date  12/03/19      PT LONG TERM GOAL #2   Title  Patient to report decrease in neck pain by 50% or more with ADLs.    Time  4    Period  Weeks    Status  New      PT LONG TERM GOAL #3   Title  improved cervcial strength to 5/5.    Time  4    Period  Weeks    Status  New            Plan - 11/12/19 1541    Clinical Impression Statement  Patient reporting relief  today with pain in RUE and neck. He was surprised because it is the end of the day. Pt with tension in median and radial nerves and glides issued for HEP. Pt wanted to try DN and he responded well. We held traction today to see effects.    Comorbidities  Rt wrist torn cartilage, bil RCR, left knee and right elbow surgeries    PT Treatment/Interventions  ADLs/Self Care Home Management;Cryotherapy;Electrical Stimulation;Moist Heat;Traction;Therapeutic exercise;Neuromuscular re-education;Patient/family education;Manual techniques;Dry needling;Taping;Spinal Manipulations    PT Next Visit Plan  Assess DN; continue with traction as needed. Progress cervical stab in prone.    PT Home Exercise Plan  [id number]       Patient will benefit from skilled therapeutic intervention in order to improve the following deficits and impairments:  Decreased range of motion, Increased muscle spasms, Pain, Decreased strength, Postural dysfunction, Impaired flexibility  Visit Diagnosis: Radiculopathy, cervical region     Problem List Patient Active Problem List   Diagnosis Date Noted  . ECRB (extensor carpi radialis brevis) tenosynovitis   . Medial epicondylitis of right elbow   . Lateral epicondylitis of right elbow    Andrew Nolan PT 11/12/2019, 3:45 PM  [facility] [address] [place], [place], [address] Phone: [phone]   Fax:  [phone]  Name: Andrew Nolan MRN: [id number] Date of Birth: [birth year]

## 2019-12-09 ENCOUNTER — Ambulatory Visit: Payer: 59 | Attending: Neurosurgery | Admitting: Physical Therapy

## 2019-12-09 ENCOUNTER — Ambulatory Visit: Payer: 59 | Admitting: Allergy & Immunology

## 2019-12-09 ENCOUNTER — Other Ambulatory Visit: Payer: Self-pay

## 2019-12-09 ENCOUNTER — Encounter: Payer: Self-pay | Admitting: Allergy & Immunology

## 2019-12-09 VITALS — BP 142/82 | HR 59 | Temp 97.6°F | Resp 16 | Ht 71.5 in | Wt 215.8 lb

## 2019-12-09 DIAGNOSIS — M5412 Radiculopathy, cervical region: Secondary | ICD-10-CM

## 2019-12-09 DIAGNOSIS — J3089 Other allergic rhinitis: Secondary | ICD-10-CM | POA: Diagnosis not present

## 2019-12-09 DIAGNOSIS — J302 Other seasonal allergic rhinitis: Secondary | ICD-10-CM | POA: Insufficient documentation

## 2019-12-09 MED ORDER — XHANCE 93 MCG/ACT NA EXHU: 2.0000 | INHALANT_SUSPENSION | Freq: Two times a day (BID) | NASAL | 5 refills | Status: DC

## 2019-12-09 MED ORDER — AZELASTINE HCL 0.1 % NA SOLN: NASAL | 5 refills | Status: DC

## 2019-12-09 MED ORDER — LEVOCETIRIZINE DIHYDROCHLORIDE 5 MG PO TABS: 5.0000 mg | ORAL_TABLET | Freq: Every evening | ORAL | 5 refills | Status: DC

## 2019-12-09 NOTE — Progress Notes (Signed)
E NU  NEW PATIENT  Date of Service/Encounter:  12/09/19  Referring provider: Sueanne Margarita, DO   Assessment:   Seasonal and perennial allergic rhinitis (grasses, ragweed, weeds, trees, indoor molds, outdoor molds and dust mites)  Plan/Recommendations:   1. Seasonal and perennial allergic rhinitis - Testing today showed: grasses, ragweed, weeds, trees, indoor molds, outdoor molds and dust mites - Copy of test results provided.  - Avoidance measures provided. - Stop taking: Flonase and Allegra - Start taking: Xyzal (levocetirizine) 5mg  tablet once daily, Xhance (fluticasone) 1-2 sprays per nostril daily and Astelin (azelastine) 2 sprays per nostril 1-2 times daily as needed - You can use an extra dose of the antihistamine, if needed, for breakthrough symptoms.  - Consider nasal saline rinses 1-2 times daily to remove allergens from the nasal cavities as well as help with mucous clearance (this is especially helpful to do before the nasal sprays are given) - Consider allergy shots as a means of long-term control. - Allergy shots "re-train" and "reset" the immune system to ignore environmental allergens and decrease the resulting immune response to those allergens (sneezing, itchy watery eyes, runny nose, nasal congestion, etc).    - Allergy shots improve symptoms in 75-85% of patients.  - We can discuss more at the next appointment if the medications are not working for you.  2. Return in about 6 weeks (around 01/20/2020). This can be an in-person, a virtual Webex or a telephone follow up visit.   Subjective:   Andrew Nolan is a 59 y.o. male presenting today for evaluation of  Chief Complaint  Patient presents with  . Allergic Rhinitis     sneezing, runny nose, eyes burning, ears itching on the inside  . Nasal Congestion    flonase, allegra, not helping, hot shower helps, has a stuffy nose all the time  . Other    trouble sleeping because of the nasal stuffiness, sinus pain,  pressure, tenderness  . Allergic Reaction    bee venom    Andrew Nolan has a history of the following: Patient Active Problem List   Diagnosis Date Noted  . ECRB (extensor carpi radialis brevis) tenosynovitis   . Medial epicondylitis of right elbow   . Lateral epicondylitis of right elbow     History obtained from: chart review and patient.  [name] was referred by Sueanne Margarita, DO.     [name] is a 59 y.o. male presenting for an evaluation of chronic rhinitis.  He reports persistent congestion that has been ongoing forever. He used to take Seldane when this was on the market. He was on Claritin after that but he had blood pressure issues. He has been taking Allegra one tablet daily as needed. He does not take it religiously because he is not sure that it works at this point. He is on Flonase which has helped "a little". This was started a few weeks ago. He was on a nose spray intermittently for years, but he was not using it correctly (spraying straight up instead of sideways). He has never heard for Astelin or Patanase. He was on Afrin several years ago and he has weaned off of that. He does use Vicks without oxymetazoline.   He was allergy tested a long time ago. Nothing was positive. He thinks that this was 20-25 years ago. He thinks that it was here at our practice, but he is not entirely sure. He was never on allergy shots.   He plays video games  for the most part. 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 He does a lot of volunteer work at [employer]. He has two sons. One is a paramedic in [facility] and the youngest is currently unemployed. He was working in [employer], but apparently the whole family contracted [place]. C[place]ter and the entire family is all vaccinated now.   He did have an episode of urticaria when he was younger after eating a large quantity of almonds. However, he eats them all of the time without an issue now.   Otherwise,  there is no history of other atopic diseases, including asthma, food allergies, drug allergies, stinging insect allergies, eczema, urticaria or contact dermatitis. There is no significant infectious history. Vaccinations are up to date.    Past Medical History: Patient Active Problem List   Diagnosis Date Noted  . ECRB (extensor carpi radialis brevis) tenosynovitis   . Medial epicondylitis of right elbow   . Lateral epicondylitis of right elbow     Medication List:  Allergies as of 12/09/2019      Reactions   Codeine    REACTION: itching   Yellow Jacket Venom [bee Venom] Hives, Itching      Medication List       Accurate as of December 09, 2019 12:08 PM. If you have any questions, ask your nurse or doctor.        azelastine 0.1 % nasal spray Commonly known as: ASTELIN 2 sprays per nostril 1-2 times daily as needed Started by: Valentina Shaggy, MD   halobetasol 0.05 % ointment Commonly known as: ULTRAVATE   levocetirizine 5 MG tablet Commonly known as: XYZAL Take 1 tablet (5 mg total) by mouth every evening. Started by: Valentina Shaggy, MD   losartan 100 MG tablet Commonly known as: COZAAR Take 100 mg by mouth daily.   omeprazole 20 MG capsule Commonly known as: PRILOSEC Take 20 mg by mouth 2 (two) times daily before a meal.   sildenafil 50 MG tablet Commonly known as: VIAGRA   Xhance 93 MCG/ACT Exhu Generic drug: Fluticasone Propionate Place 2 sprays into the nose in the morning and at bedtime. Started by: Valentina Shaggy, MD       Birth History: non-contributory  Developmental History: non-contributory  Past Surgical History: Past Surgical History:  Procedure Laterality Date  . COLONOSCOPY  06/17/2009   dr Deatra Ina   . FRACTURE SURGERY     lt thumb-age 67  . LATERAL EPICONDYLE RELEASE Right 11/11/2013   Procedure: RIGHT TENOTOMY ELBOW LATERAL EPICONDYLITIS TENNIS ELBOW/RIGHT ELBOW INJECTION ASPIRATION ARTHROCENTESIS INTERMEDULLARY JOINT  BURSA;  Surgeon: Lorn Junes, MD;  Location: [facility];  Service: Orthopedics;  Laterality: Right;  . ORIF METACARPAL FRACTURE  1982   right hand  . ORIF [place]   compound-right  . ORIF WRIST FRACTURE  1975   left  . SHOULDER ARTHROSCOPY W/ LABRAL REPAIR  1999   right  . SHOULDER ARTHROSCOPY W/ ROTATOR CUFF REPAIR  2005   left  . UPPER GI ENDOSCOPY    . VASECTOMY    . WISDOM TOOTH EXTRACTION       Family History: Family History  Problem Relation Age of Onset  . Heart attack Mother 2       deceased  . Heart attack Father 58  . Colon cancer Neg Hx   . Rectal cancer Neg Hx   . Stomach cancer Neg  Hx      Social History: Jacorri lives at home with his wife and 1 year old son. They live ina house that is 41 years old. There is hardwood throughout the home. They have gas heating and central cooling. There are no animals inside or outside of the home. There are dust mite coverings on the bedding. There is no tobacco exposure at all. He is not exposed to fumes or chemicals. 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     Review of Systems  Constitutional: Negative.  Negative for fever, malaise/fatigue and weight loss.  HENT: Positive for congestion and sinus pain. Negative for ear discharge and ear pain.        Positive for postnasal drip.  Eyes: Negative for pain, discharge and redness.  Respiratory: Negative for cough, sputum production, shortness of breath and wheezing.   Cardiovascular: Negative.  Negative for chest pain and palpitations.  Gastrointestinal: Negative for abdominal pain, constipation, diarrhea, heartburn, nausea and vomiting.  Skin: Negative.  Negative for itching and rash.  Neurological: Negative for dizziness and headaches.  Endo/Heme/Allergies: Negative for environmental allergies. Does not bruise/bleed easily.       Objective:   Blood pressure (!) 142/82, pulse (!) 59, temperature 97.6  F (36.4 C), temperature source Temporal, resp. rate 16, height 5' 11.5" (1.816 m), weight 215 lb 12.8 oz (97.9 kg), SpO2 98 %. Body mass index is 29.68 kg/m.   Physical Exam:   Physical Exam  Constitutional: He appears well-developed.  HENT:  Head: Normocephalic and atraumatic.  Right Ear: Tympanic membrane, external ear and ear canal normal. No drainage, swelling or tenderness. Tympanic membrane is not injected, not scarred, not erythematous, not retracted and not bulging.  Left Ear: Tympanic membrane, external ear and ear canal normal. No drainage, swelling or tenderness. Tympanic membrane is not injected, not scarred, not erythematous, not retracted and not bulging.  Nose: No mucosal edema, rhinorrhea, nasal deformity or septal deviation. No epistaxis. Right sinus exhibits no maxillary sinus tenderness and no frontal sinus tenderness. Left sinus exhibits no maxillary sinus tenderness and no frontal sinus tenderness.  Mouth/Throat: Uvula is midline and oropharynx is clear and moist. Mucous membranes are not pale and not dry.  Eyes: Pupils are equal, round, and reactive to light. Conjunctivae and EOM are normal. Right eye exhibits no chemosis and no discharge. Left eye exhibits no chemosis and no discharge. Right conjunctiva is not injected. Left conjunctiva is not injected.  Cardiovascular: Normal rate, regular rhythm and normal heart sounds.  Respiratory: Effort normal and breath sounds normal. No accessory muscle usage. No tachypnea. No respiratory distress. He has no wheezes. He has no rhonchi. He has no rales. He exhibits no tenderness.  GI: There is no abdominal tenderness. There is no rebound and no guarding.  Lymphadenopathy:       Head (right side): No submandibular, no tonsillar and no occipital adenopathy present.       Head (left side): No submandibular, no tonsillar and no occipital adenopathy present.    He has no cervical adenopathy.  Neurological: He is alert.  Skin: No  abrasion, no petechiae and no rash noted. Rash is not papular, not vesicular and not urticarial. No erythema. No pallor.  Psychiatric: He has a normal mood and affect.     Diagnostic studies:     Allergy Studies:    Airborne Adult Perc - 12/09/19 0936    Time Antigen Placed  [phone]  Allergen Manufacturer  Greer    Location  Back    Number of Test  59    Panel 1  Select    1. Control-Buffer 50% Glycerol  Negative    2. Control-Histamine 1 mg/ml  2+    3. Albumin saline  Negative    4. Franklin  2+    5. [place]  3+    6. Johnson  2+    7. Kentucky Blue  2+    8. Meadow Fescue  3+    9. Perennial Rye  4+    10. Sweet Vernal  4+    11. Timothy  4+    12. Cocklebur  Negative    13. Burweed Marshelder  Negative    14. Ragweed, short  3+    15. Ragweed, Giant  Negative    16. Plantain,  English  2+    17. Lamb's Quarters  Negative    18. Sheep Sorrell  Negative    19. Rough Pigweed  Negative    20. Marsh Elder, Rough  Negative    21. Mugwort, Common  Negative    22. Ash mix  Negative    23. Birch mix  Negative    24. Beech American  Negative    25. Box, Elder  Negative    26. Cedar, red  Negative    27. Cottonwood, [place]  Negative    28. Elm mix  Negative    29. Hickory mix  Negative    30. Maple mix  Negative    31. Oak, [place] mix  2+    32. Pecan Pollen  Negative    33. Pine mix  Negative    34. Sycamore Eastern  Negative    35. [place], Black Pollen  Negative    36. Alternaria alternata  Negative    37. Cladosporium Herbarum  Negative    38. Aspergillus mix  Negative    39. Penicillium mix  Negative    40. Bipolaris sorokiniana (Helminthosporium)  Negative    41. Drechslera spicifera (Curvularia)  Negative    42. Mucor plumbeus  Negative    43. Fusarium moniliforme  Negative    44. Aureobasidium pullulans (pullulara)  Negative    45. Rhizopus oryzae  Negative    46. Botrytis cinera  Negative    47. Epicoccum nigrum  Negative    48. Phoma betae  Negative     49. Candida Albicans  Negative    50. Trichophyton mentagrophytes  Negative    51. Mite, D Farinae  5,000 AU/ml  Negative    52. Mite, D Pteronyssinus  5,000 AU/ml  Negative    53. Cat Hair 10,000 BAU/ml  Negative    54.  Dog Epithelia  Negative    55. Mixed Feathers  Negative    56. Horse Epithelia  Negative    57. Cockroach, German  Negative    58. Mouse  Negative    59. Tobacco Leaf  Negative     Intradermal - 12/09/19 1032    Time Antigen Placed  1032    Allergen Manufacturer  Lavella Hammock    Location  Back    Number of Test  10    Intradermal  Select    Control  Negative    Weed mix  Negative    Mold 1  Negative    Mold 2  2+    Mold 3  2+    Mold 4  Negative    Cat  Negative    Dog  Negative    Cockroach  Negative    Mite mix  2+       Allergy testing results were read and interpreted by myself, documented by clinical staff.         Salvatore Marvel, MD Allergy and [facility] [place]

## 2019-12-09 NOTE — Therapy (Signed)
[facility] [address] [place], [place], [address] Phone: [phone]   Fax:  [phone]  Physical Therapy Treatment  Patient Details  Name: Andrew Nolan MRN: [id number] Date of Birth: [birth year] Referring Provider (PT): Jovita Gamma MD   Encounter Date: 12/09/2019  PT End of Session - 12/09/19 1107    Visit Number  4    Number of Visits  8    Date for PT Re-Evaluation  12/03/19    PT Start Time  1106    PT Stop Time  1142   pt 6 min late and had DN   PT Time Calculation (min)  36 min    Activity Tolerance  Patient tolerated treatment well    Behavior During Therapy  [facility] for tasks assessed/performed       Past Medical History:  Diagnosis Date  . Allergy   . Arthritis   . ECRB (extensor carpi radialis brevis) tenosynovitis   . GERD (gastroesophageal reflux disease)    occasional   . Hyperlipidemia   . Hypertension   . Lateral epicondylitis of right elbow   . Medial epicondylitis of right elbow   . PONV (postoperative nausea and vomiting)    only with one surgery in 2005, no other problems with other surgeries  . Shingles   . Wears glasses     Past Surgical History:  Procedure Laterality Date  . COLONOSCOPY  06/17/2009   dr Deatra Ina   . FRACTURE SURGERY     lt thumb-age 81  . LATERAL EPICONDYLE RELEASE Right 11/11/2013   Procedure: RIGHT TENOTOMY ELBOW LATERAL EPICONDYLITIS TENNIS ELBOW/RIGHT ELBOW INJECTION ASPIRATION ARTHROCENTESIS INTERMEDULLARY JOINT BURSA;  Surgeon: Lorn Junes, MD;  Location: [place];  Service: Orthopedics;  Laterality: Right;  . ORIF METACARPAL FRACTURE  1982   right hand  . ORIF [place]   compound-right  . ORIF WRIST FRACTURE  1975   left  . SHOULDER ARTHROSCOPY W/ LABRAL REPAIR  1999   right  . SHOULDER ARTHROSCOPY W/ ROTATOR CUFF REPAIR  2005   left  . UPPER GI ENDOSCOPY    . VASECTOMY    . WISDOM TOOTH EXTRACTION       There were no vitals filed for this visit.  Subjective Assessment - 12/09/19 1107    Subjective  Patient having cervical fusion surgery on 12/18/19. Feeling  some sx into left arm today.    Pertinent History  Rt wrist torn cartilage, bil RCR, left knee and right elbow surgeries    Patient Stated Goals  to avoid surgery    Currently in Pain?  Yes    Pain Score  3     Pain Location  Neck    Pain Orientation  Right    Pain Descriptors / Indicators  Sore    Pain Type  Chronic pain                       [facility] Adult PT Treatment/Exercise - 12/09/19 0001      Self-Care   Self-Care  Other Self-Care Comments    Other Self-Care Comments   discussed POC due to upcoming surgery      Neck Exercises: Seated   Other Seated Exercise  cervical isometrics reviewed in each direction       Manual Therapy   Manual Therapy  Soft tissue mobilization    Manual therapy comments  Skilled palpation and monitoring of  soft tissues during DN     Soft tissue mobilization  to cervical paraspinals and bil UT       Trigger Point Dry Needling - 12/09/19 0001    Consent Given?  Yes    Education Handout Provided  Previously provided    Muscles Treated Head and Neck  Upper trapezius;Cervical multifidi    Muscles Treated Upper Quadrant  Pectoralis major;Pectoralis minor   right   Upper Trapezius Response  Twitch reponse elicited;Palpable increased muscle length    Cervical multifidi Response  Palpable increased muscle length    Pectoralis Major Response  Palpable increased muscle length    Pectoralis Minor Response  Palpable increased muscle length           PT Education - 12/09/19 1432    Education Details  reviewed cervical isometrics to perform pre-surgery    Person(s) Educated  Patient    Methods  Explanation;Demonstration    Comprehension  Verbalized understanding;Returned demonstration       PT Short Term Goals - 12/09/19 1437      PT SHORT TERM GOAL #1   Title  Ind with  initial HEP    Status  Achieved        PT Long Term Goals - 12/09/19 1437      PT LONG TERM GOAL #1   Title  Patient to report decreased incidence of RUE sx by 80% or more.    Status  Not Met      PT LONG TERM GOAL #2   Title  Patient to report decrease in neck pain by 50% or more with ADLs.    Status  Not Met      PT LONG TERM GOAL #3   Title  improved cervcial strength to 5/5.    Baseline  right SB 4+/5, else 5/5    Status  Partially Met            Plan - 12/09/19 1434    Clinical Impression Statement  Patient presents with reports that he has seen his surgeon and is scheduled for surgery on 12/18/19. He continues to have pain in the RUE with no lasting relief from nerve glides or manual therapy. We discussed likely outcomes from surgery and likelihood of not needing PT after surgery. He has had some relief from DN so we continued with this today. He will return for one more session next week prior to surgery and then be discharged.    Comorbidities  Rt wrist torn cartilage, bil RCR, left knee and right elbow surgeries    PT Frequency  2x / week    PT Duration  4 weeks    PT Treatment/Interventions  ADLs/Self Care Home Management;Cryotherapy;Electrical Stimulation;Moist Heat;Traction;Therapeutic exercise;Neuromuscular re-education;Patient/family education;Manual techniques;Dry needling;Taping;Spinal Manipulations    PT Next Visit Plan  Assess DN; Progress cervical stab in prone. FOTO and D/C.    PT Home Exercise Plan  [id number]    Consulted and Agree with Plan of Care  Patient       Patient will benefit from skilled therapeutic intervention in order to improve the following deficits and impairments:  Decreased range of motion, Increased muscle spasms, Pain, Decreased strength, Postural dysfunction, Impaired flexibility  Visit Diagnosis: Radiculopathy, cervical region     Problem List Patient Active Problem List   Diagnosis Date Noted  . Seasonal and perennial  allergic rhinitis 12/09/2019  . ECRB (extensor carpi radialis brevis) tenosynovitis   . Medial epicondylitis of right elbow   . Lateral epicondylitis  of right elbow     [name] PT 12/09/2019, 3:58 PM  [facility] [address] [place], [place], [address] Phone: [phone]   Fax:  [phone]  Name: WAYLON HERSHEY MRN: [id number] Date of Birth: 18-Feb-1961

## 2019-12-09 NOTE — Patient Instructions (Addendum)
1. Seasonal and perennial allergic rhinitis - Testing today showed: grasses, ragweed, weeds, trees, indoor molds, outdoor molds and dust mites - Copy of test results provided.  - Avoidance measures provided. - Stop taking: Flonase and Allegra - Start taking: Xyzal (levocetirizine) 5mg  tablet once daily, Xhance (fluticasone) 1-2 sprays per nostril daily and Astelin (azelastine) 2 sprays per nostril 1-2 times daily as needed - You can use an extra dose of the antihistamine, if needed, for breakthrough symptoms.  - Consider nasal saline rinses 1-2 times daily to remove allergens from the nasal cavities as well as help with mucous clearance (this is especially helpful to do before the nasal sprays are given) - Consider allergy shots as a means of long-term control. - Allergy shots "re-train" and "reset" the immune system to ignore environmental allergens and decrease the resulting immune response to those allergens (sneezing, itchy watery eyes, runny nose, nasal congestion, etc).    - Allergy shots improve symptoms in 75-85% of patients.  - We can discuss more at the next appointment if the medications are not working for you.  2. Return in about 6 weeks (around 01/20/2020). This can be an in-person, a virtual Webex or a telephone follow up visit.   Please inform us of any Emergency Department visits, hospitalizations, or changes in symptoms. Call us before going to the ED for breathing or allergy symptoms since we might be able to fit you in for a sick visit. Feel free to contact us anytime with any questions, problems, or concerns.  It was a pleasure to meet you today!  Websites that have reliable patient information: 1. American Academy of Asthma, Allergy, and Immunology: www.aaaai.org 2. Food Allergy Research and Education (FARE): foodallergy.org 3. Mothers of Asthmatics: http://www.asthmacommunitynetwork.org 4. American College of Allergy, Asthma, and Immunology: www.acaai.org   COVID-19  Vaccine Information can be found at: [email or web address].uk For questions related to vaccine distribution or appointments, please email [email or web address] or call [phone].     "Like" [place] on Facebook and Instagram for our latest updates!       HAPPY SPRING!  Make sure you are registered to vote! If you have moved or changed any of your contact information, you will need to get this updated before voting!  In some cases, you MAY be able to register to vote online: [email or web address]    You can buy saline nose drops at a pharmacy, or you can make your own saline solution:  1. Add 1 cup (240 mL) distilled water to a clean container. If you use tap water, boil it first to sterilize it, and then let it cool until it is lukewarm.  2. Add 0.5 tsp (2.5 g) salt to the water. 3. Add 0.5 tsp (2.5 g) baking soda.  Reducing Pollen Exposure  The American Academy of Allergy, Asthma and Immunology suggests the following steps to reduce your exposure to pollen during allergy seasons.    1. Do not hang sheets or clothing out to dry; pollen may collect on these items. 2. Do not mow lawns or spend time around freshly cut grass; mowing stirs up pollen. 3. Keep windows closed at night.  Keep car windows closed while driving. 4. Minimize morning activities outdoors, a time when pollen counts are usually at their highest. 5. Stay indoors as much as possible when pollen counts or humidity is high and on windy days when pollen tends to remain in the air longer. 6. Use air conditioning when possible.  Many air conditioners have filters that trap  the pollen spores. 7. Use a HEPA room air filter to remove pollen form the indoor air you breathe.  Control of Mold Allergen   Mold and fungi can grow on a variety of surfaces provided certain temperature and moisture conditions exist.  Outdoor molds grow on plants, decaying  vegetation and soil.  The major outdoor mold, Alternaria and Cladosporium, are found in very high numbers during hot and dry conditions.  Generally, a late Summer - Fall peak is seen for common outdoor fungal spores.  Rain will temporarily lower outdoor mold spore count, but counts rise rapidly when the rainy period ends.  The most important indoor molds are Aspergillus and Penicillium.  Dark, humid and poorly ventilated basements are ideal sites for mold growth.  The next most common sites of mold growth are the bathroom and the kitchen.  Outdoor (Seasonal) Mold Control  Positive outdoor molds via skin testing: Bipolaris (Helminthsporium), Drechslera (Curvalaria) and Mucor  1. Use air conditioning and keep windows closed 2. Avoid exposure to decaying vegetation. 3. Avoid leaf raking. 4. Avoid grain handling. 5. Consider wearing a face mask if working in moldy areas.  6.   Indoor (Perennial) Mold Control   Positive indoor molds via skin testing: Aspergillus and Penicillium  1. Maintain humidity below 50%. 2. Clean washable surfaces with 5% bleach solution. 3. Remove sources e.g. contaminated carpets.     Control of Dust Mite Allergen    Dust mites play a major role in allergic asthma and rhinitis.  They occur in environments with high humidity wherever human skin is found.  Dust mites absorb humidity from the atmosphere (ie, they do not drink) and feed on organic matter (including shed human and animal skin).  Dust mites are a microscopic type of insect that you cannot see with the naked eye.  High levels of dust mites have been detected from mattresses, pillows, carpets, upholstered furniture, bed covers, clothes, soft toys and any woven material.  The principal allergen of the dust mite is found in its feces.  A gram of dust may contain 1,000 mites and 250,000 fecal particles.  Mite antigen is easily measured in the air during house cleaning activities.  Dust mites do not bite and do  not cause harm to humans, other than by triggering allergies/asthma.    Ways to decrease your exposure to dust mites in your home:  1. Encase mattresses, box springs and pillows with a mite-impermeable barrier or cover   2. Wash sheets, blankets and drapes weekly in hot water (130 F) with detergent and dry them in a dryer on the hot setting.  3. Have the room cleaned frequently with a vacuum cleaner and a damp dust-mop.  For carpeting or rugs, vacuuming with a vacuum cleaner equipped with a high-efficiency particulate air (HEPA) filter.  The dust mite allergic individual should not be in a room which is being cleaned and should wait 1 hour after cleaning before going into the room. 4. Do not sleep on upholstered furniture (eg, couches).   5. If possible removing carpeting, upholstered furniture and drapery from the home is ideal.  Horizontal blinds should be eliminated in the rooms where the person spends the most time (bedroom, study, television room).  Washable vinyl, roller-type shades are optimal. 6. Remove all non-washable stuffed toys from the bedroom.  Wash stuffed toys weekly like sheets and blankets above.   7. Reduce indoor humidity to less than 50%.  Inexpensive humidity monitors can be purchased at most hardware  stores.  Do not use a humidifier as can make the problem worse and are not recommended.  Allergy Shots   Allergies are the result of a chain reaction that starts in the immune system. Your immune system controls how your body defends itself. For instance, if you have an allergy to pollen, your immune system identifies pollen as an invader or allergen. Your immune system overreacts by producing antibodies called Immunoglobulin E (IgE). These antibodies travel to cells that release chemicals, causing an allergic reaction.  The concept behind allergy immunotherapy, whether it is received in the form of shots or tablets, is that the immune system can be desensitized to specific  allergens that trigger allergy symptoms. Although it requires time and patience, the payback can be long-term relief.  How Do Allergy Shots Work?  Allergy shots work much like a vaccine. Your body responds to injected amounts of a particular allergen given in increasing doses, eventually developing a resistance and tolerance to it. Allergy shots can lead to decreased, minimal or no allergy symptoms.  There generally are two phases: build-up and maintenance. Build-up often ranges from three to six months and involves receiving injections with increasing amounts of the allergens. The shots are typically given once or twice a week, though more rapid build-up schedules are sometimes used.  The maintenance phase begins when the most effective dose is reached. This dose is different for each person, depending on how allergic you are and your response to the build-up injections. Once the maintenance dose is reached, there are longer periods between injections, typically two to four weeks.  Occasionally doctors give cortisone-type shots that can temporarily reduce allergy symptoms. These types of shots are different and should not be confused with allergy immunotherapy shots.  Who Can Be Treated with Allergy Shots?  Allergy shots may be a good treatment approach for people with allergic rhinitis (hay fever), allergic asthma, conjunctivitis (eye allergy) or stinging insect allergy.   Before deciding to begin allergy shots, you should consider:  . The length of allergy season and the severity of your symptoms . Whether medications and/or changes to your environment can control your symptoms . Your desire to avoid long-term medication use . Time: allergy immunotherapy requires a major time commitment . Cost: may vary depending on your insurance coverage  Allergy shots for children age 66 and older are effective and often well tolerated. They might prevent the onset of new allergen sensitivities or the  progression to asthma.  Allergy shots are not started on patients who are pregnant but can be continued on patients who become pregnant while receiving them. In some patients with other medical conditions or who take certain common medications, allergy shots may be of risk. It is important to mention other medications you talk to your allergist.   When Will I Feel Better?  Some may experience decreased allergy symptoms during the build-up phase. For others, it may take as long as 12 months on the maintenance dose. If there is no improvement after a year of maintenance, your allergist will discuss other treatment options with you.  If you aren't responding to allergy shots, it may be because there is not enough dose of the allergen in your vaccine or there are missing allergens that were not identified during your allergy testing. Other reasons could be that there are high levels of the allergen in your environment or major exposure to non-allergic triggers like tobacco smoke.  What Is the Length of Treatment?  Once the  maintenance dose is reached, allergy shots are generally continued for three to five years. The decision to stop should be discussed with your allergist at that time. Some people may experience a permanent reduction of allergy symptoms. Others may relapse and a longer course of allergy shots can be considered.  What Are the Possible Reactions?  The two types of adverse reactions that can occur with allergy shots are local and systemic. Common local reactions include very mild redness and swelling at the injection site, which can happen immediately or several hours after. A systemic reaction, which is less common, affects the entire body or a particular body system. They are usually mild and typically respond quickly to medications. Signs include increased allergy symptoms such as sneezing, a stuffy nose or hives.  Rarely, a serious systemic reaction called anaphylaxis can develop.  Symptoms include swelling in the throat, wheezing, a feeling of tightness in the chest, nausea or dizziness. Most serious systemic reactions develop within 30 minutes of allergy shots. This is why it is strongly recommended you wait in your doctor's office for 30 minutes after your injections. Your allergist is trained to watch for reactions, and his or her staff is trained and equipped with the proper medications to identify and treat them.  Who Should Administer Allergy Shots?  The preferred location for receiving shots is your prescribing allergist's office. Injections can sometimes be given at another facility where the physician and staff are trained to recognize and treat reactions, and have received instructions by your prescribing allergist.

## 2019-12-11 ENCOUNTER — Encounter: Payer: 59 | Admitting: Physical Therapy

## 2019-12-16 ENCOUNTER — Encounter: Payer: Self-pay | Admitting: Physical Therapy

## 2019-12-16 ENCOUNTER — Other Ambulatory Visit: Payer: Self-pay

## 2019-12-16 ENCOUNTER — Ambulatory Visit: Payer: 59 | Admitting: Physical Therapy

## 2019-12-16 DIAGNOSIS — M5412 Radiculopathy, cervical region: Secondary | ICD-10-CM

## 2019-12-16 NOTE — Therapy (Signed)
[facility] [address] [place], [place], [address] Phone: [phone]   Fax:  [phone]  Physical Therapy Treatment and Discharge Summary  Patient Details  Name: Andrew Nolan MRN: [id number] Date of Birth: [birth year] Referring Provider (PT): Jovita Gamma MD   Encounter Date: 12/16/2019  PT End of Session - 12/16/19 0933    Visit Number  5    Number of Visits  8    Date for PT Re-Evaluation  12/03/19    PT Start Time  0933    PT Stop Time  1004    PT Time Calculation (min)  31 min    Activity Tolerance  Patient tolerated treatment well    Behavior During Therapy  [facility] for tasks assessed/performed       Past Medical History:  Diagnosis Date  . Allergy   . Arthritis   . ECRB (extensor carpi radialis brevis) tenosynovitis   . GERD (gastroesophageal reflux disease)    occasional   . Hyperlipidemia   . Hypertension   . Lateral epicondylitis of right elbow   . Medial epicondylitis of right elbow   . PONV (postoperative nausea and vomiting)    only with one surgery in 2005, no other problems with other surgeries  . Shingles   . Wears glasses     Past Surgical History:  Procedure Laterality Date  . COLONOSCOPY  06/17/2009   dr Deatra Ina   . FRACTURE SURGERY     lt thumb-age 74  . LATERAL EPICONDYLE RELEASE Right 11/11/2013   Procedure: RIGHT TENOTOMY ELBOW LATERAL EPICONDYLITIS TENNIS ELBOW/RIGHT ELBOW INJECTION ASPIRATION ARTHROCENTESIS INTERMEDULLARY JOINT BURSA;  Surgeon: Lorn Junes, MD;  Location: [facility];  Service: Orthopedics;  Laterality: Right;  . ORIF METACARPAL FRACTURE  1982   right hand  . ORIF [place]   compound-right  . ORIF WRIST FRACTURE  1975   left  . SHOULDER ARTHROSCOPY W/ LABRAL REPAIR  1999   right  . SHOULDER ARTHROSCOPY W/ ROTATOR CUFF REPAIR  2005   left  . UPPER GI ENDOSCOPY    . VASECTOMY    . WISDOM TOOTH EXTRACTION       There were no vitals filed for this visit.  Subjective Assessment - 12/16/19 0945    Subjective  Patient reports his pain level in am is 2-3/10 and then is up to 6/10. Patient reporting increased tension and pain increases. "It's to the point I am ready for surgery".    Pertinent History  Rt wrist torn cartilage, bil RCR, left knee and right elbow surgeries    Patient Stated Goals  to avoid surgery    Currently in Pain?  Yes    Pain Location  Neck    Pain Orientation  Right    Pain Descriptors / Indicators  Sore    Pain Type  Chronic pain         [facility] PT Assessment - 12/16/19 0001      Observation/Other Assessments   Focus on Therapeutic Outcomes (FOTO)   55% limited         Treatment: Likely exercises for post surgery   Seated Posture using lumbar roll   Cervical AROM - rotation and flex/ext  Shoulder AROM   Chin tucks, scapular retraction and shoulder shrugs   Walking program   Supine pelvic tilts 5 x 5 sec hold   Spinal decompression: breathing for relaxation x 5; shoulder press 5 sec hold  x 5; leg lengthener 5 x 5 sec and leg press 5 x 5 sec                  PT Education - 12/16/19 0957    Education Details  HEP progressed.    Person(s) Educated  Patient    Methods  Explanation;Demonstration;Handout    Comprehension  Verbalized understanding;Returned demonstration       PT Short Term Goals - 12/09/19 1437      PT SHORT TERM GOAL #1   Title  Ind with initial HEP    Status  Achieved        PT Long Term Goals - 12/16/19 6759      PT LONG TERM GOAL #1   Title  Patient to report decreased incidence of RUE sx by 80% or more.    Status  Not Met      PT LONG TERM GOAL #2   Title  Patient to report decrease in neck pain by 50% or more with ADLs.    Status  Not Met      PT LONG TERM GOAL #3   Title  improved cervcial strength to 5/5.    Baseline  right SB 4+/5, else 5/5    Status  Partially Met            Plan - 12/16/19  1005    Clinical Impression Statement  Patient presents today for his last visit prior to surgery on 12/18/19. He states he is "ready for surgery" as pain continues to worsen. He reports increased pain by end of day with tension in upper traps. We worked on decompression exercises and went over exercises he can do post surgery safely. LTGs were not met.    Comorbidities  Rt wrist torn cartilage, bil RCR, left knee and right elbow surgeries    PT Frequency  2x / week    PT Duration  4 weeks    PT Treatment/Interventions  ADLs/Self Care Home Management;Cryotherapy;Electrical Stimulation;Moist Heat;Traction;Therapeutic exercise;Neuromuscular re-education;Patient/family education;Manual techniques;Dry needling;Taping;Spinal Manipulations    PT Next Visit Plan  see d/c summary    PT Home Exercise Plan  [id number] and spinal decompression exercises.    Consulted and Agree with Plan of Care  Patient       Patient will benefit from skilled therapeutic intervention in order to improve the following deficits and impairments:  Decreased range of motion, Increased muscle spasms, Pain, Decreased strength, Postural dysfunction, Impaired flexibility  Visit Diagnosis: Radiculopathy, cervical region     Problem List Patient Active Problem List   Diagnosis Date Noted  . Seasonal and perennial allergic rhinitis 12/09/2019  . ECRB (extensor carpi radialis brevis) tenosynovitis   . Medial epicondylitis of right elbow   . Lateral epicondylitis of right elbow     [name] PT 12/16/2019, 10:10 AM  [facility] [address] [place], [place], [address] Phone: [phone]   Fax:  [phone]  Name: Andrew Nolan MRN: [id number] Date of Birth: [birth year]  PHYSICAL THERAPY DISCHARGE SUMMARY  Visits from Start of Care: 5  Current functional level related to goals / functional outcomes: See above   Remaining deficits: See above    Education / Equipment: Hep Plan: Patient agrees to discharge.  Patient goals were not met. Patient is being discharged due to lack of progress.  ?????    [name], PT 12/16/19 6:04 PM [facility] [address] [place],  [address]  Phone # [phone] Fax [phone]

## 2019-12-16 NOTE — Patient Instructions (Signed)
RE-ALIGNMENT ROUTINE EXERCISES-OSTEOPROROSIS BASIC FOR POSTURAL CORRECTION   RE-ALIGNMENT Tips BENEFITS: 1.It helps to re-align the curves of the back and improve standing posture. 2.It allows the back muscles to rest and strengthen in preparation for more activity. FREQUENCY: Daily, even after weeks, months and years of more advanced exercises. START: 1.All exercises start in the same position: lying on the back, arms resting on the supporting surface, palms up and slightly away from the body, backs of hands down, knees bent, feet flat. 2.The head, neck, arms, and legs are supported according to specific instructions of your therapist. Copyright  VHI. All rights reserved.    1. Decompression Exercise: Basic.   Takes compression off the vertebral bodies; increases tolerance for lying on the back; helps relieve back pain   Lie on back on firm surface, knees bent, feet flat, arms turned up, out to sides (~35 degrees). Head neck and arms supported as necessary. Time _5-15__ minutes. Surface: floor     2. Shoulder Press  Strengthens upper back extensors and scapular retractors.   Press both shoulders down. Hold _2-3__ seconds. Repeat _3-5__ times. Surface: floor        3. Head Press With Clifford  Strengthens neck extensors   Tuck chin SLIGHTLY toward chest, keep mouth closed. Feel weight on back of head. Increase weight by pressing head down. Hold _2-3__ seconds. Relax. Repeat 3-5___ times. Surface: floor     4. Leg Lengthener: stretches quadratus lumborum and hip flexors.  Strengthens quads and ankle dorsiflexors.  Leg Lengthener: Full    Straighten one leg. Pull toes AND forefoot toward knee, extend heel. Lengthen leg by pulling pelvis away from ribs. Hold __5_ seconds. Relax. Repeat 1 time. Re-bend knee. Do other leg. Each leg __5_ times. Surface: floor    Leg Lengthener / Leg Press Combo: Single Leg    Straighten one leg down to floor. Pull toes AND forefoot  toward knee; extend heel. Lengthen leg by pulling pelvis away from ribs. Press leg down. DO NOT BEND KNEE. Hold _5__ seconds. Relax leg. Repeat exercise 1 time. Relax leg. Re-bend knee. Repeat with other leg. Do 5 times.   Andrew Nolan, PT 12/16/19 9:56 AM [facility] [address] [place], [place] [address] Phone # [phone] Fax [phone]

## 2019-12-18 ENCOUNTER — Encounter: Payer: 59 | Admitting: Physical Therapy

## 2019-12-23 ENCOUNTER — Encounter: Payer: 59 | Admitting: Physical Therapy

## 2019-12-25 ENCOUNTER — Encounter: Payer: 59 | Admitting: Physical Therapy

## 2019-12-30 ENCOUNTER — Encounter: Payer: 59 | Admitting: Physical Therapy

## 2020-01-20 ENCOUNTER — Ambulatory Visit: Payer: 59 | Admitting: Allergy & Immunology

## 2020-05-09 IMAGING — CT CT HEART MORP W/ CTA COR W/ SCORE W/ CA W/CM &/OR W/O CM
4 of 7 series · 8 of 20 positions shown, 9 images · IV contrast (APPLIED)
Comparison: None.

CLINICAL DATA: Chest pain

EXAM:
Cardiac CTA
MEDICATIONS:
Sub lingual nitro. 4mg x 2
TECHNIQUE: The patient was scanned on a Siemens [REDACTED]ice scanner. Gantry
rotation speed was 250 msecs. Collimation was 0.6 mm. A 100 kV
prospective scan was triggered in the ascending thoracic aorta at
35-75% of the R-R interval. Average HR during the scan was 60 bpm.
The 3D data set was interpreted on a dedicated work station using
MPR, MIP and VRT modes. A total of 80cc of contrast was used.

[Series 6: best diast 71 % · axial · 0.39mm/px · z∈[+1306,+1347]mm · 2 of 306 slices shown, 3 images]
[im 102/306  vessel]
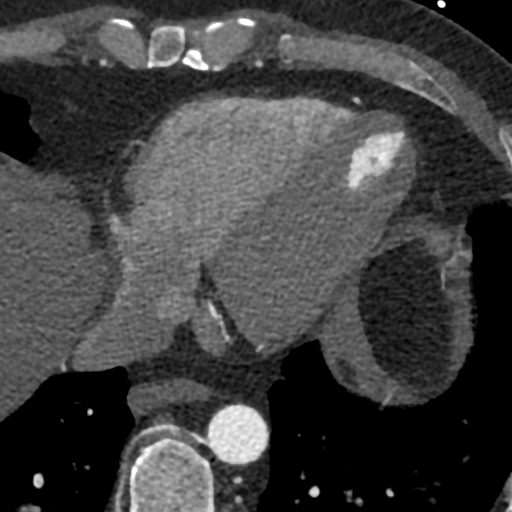
[im 102/306  lung]
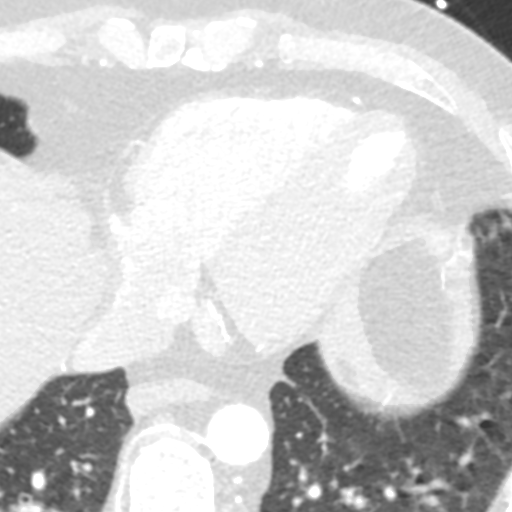
[im 204/306  vessel]
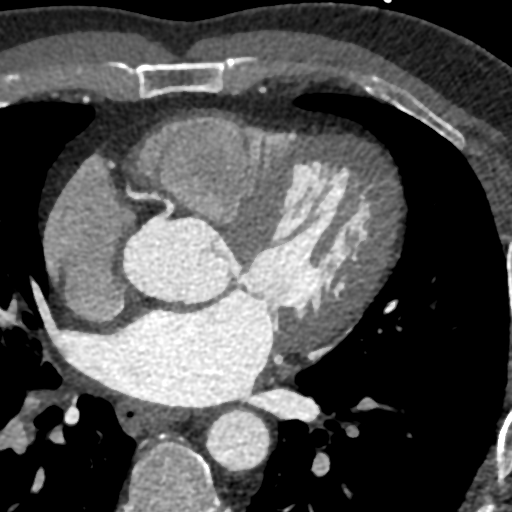

[Series 7: best syst 36 % · axial · 0.39mm/px · z∈[+1306,+1347]mm · 2 of 306 slices shown]
[im 102/306  vessel]
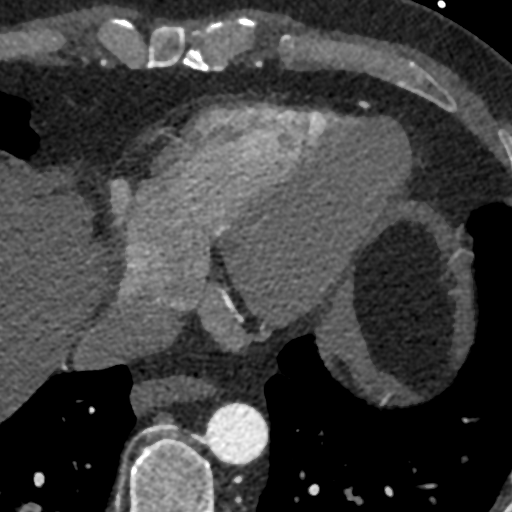
[im 204/306  vessel]
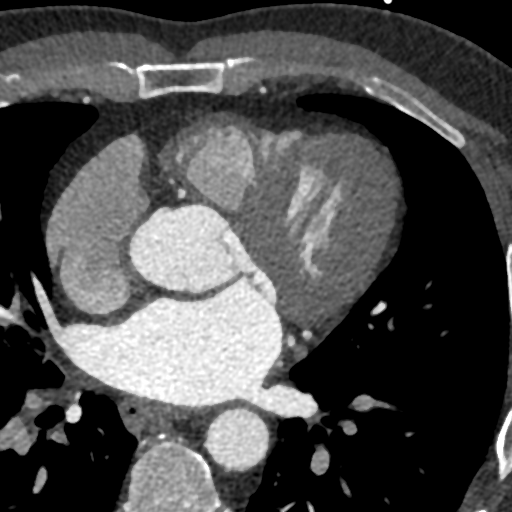

[Series 8: ts diast sharp 71 % · axial · 0.39mm/px · z∈[+1306,+1347]mm · 2 of 306 slices shown]
[im 102/306  lung]
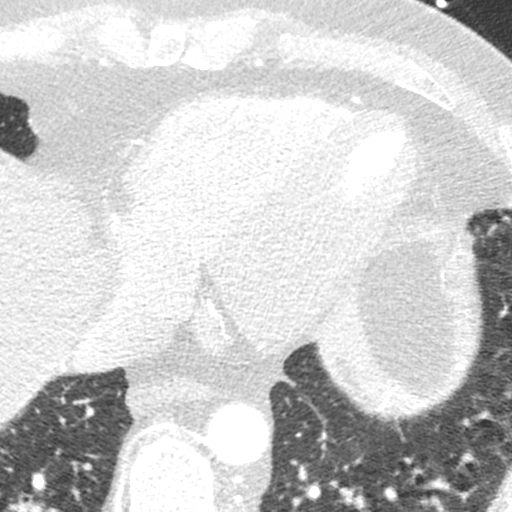
[im 204/306  lung]
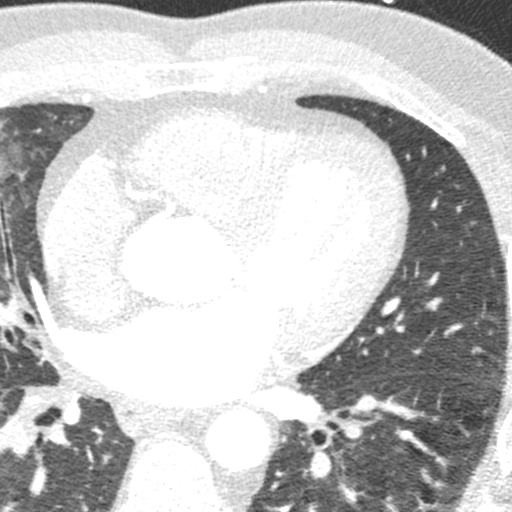

[Series 9: ts syst sharp 36 % · axial · 0.39mm/px · z∈[+1306,+1347]mm · 2 of 306 slices shown]
[im 102/306  lung]
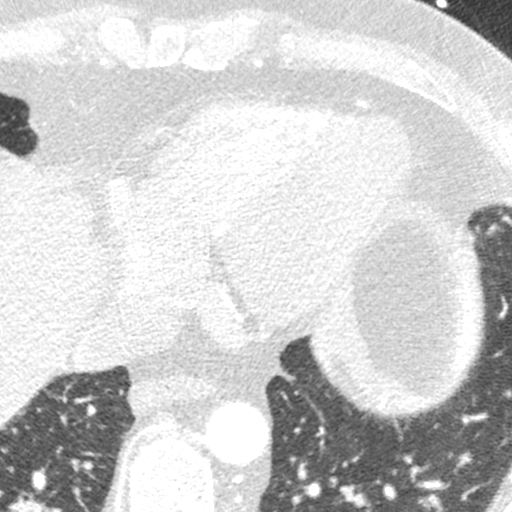
[im 204/306  lung]
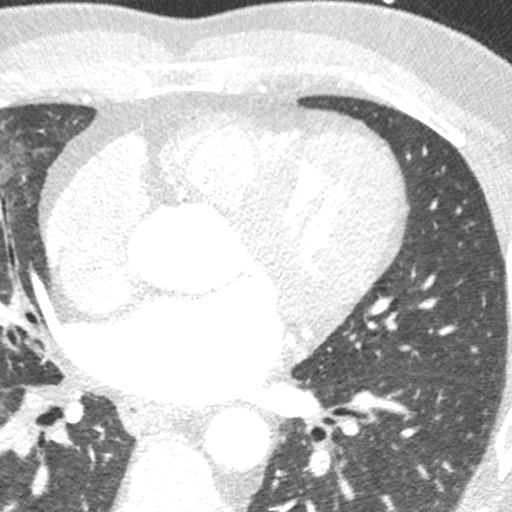

[8 of 20 positions shown; findings below may reference images not displayed]

FINDINGS: Non-cardiac: See separate report from [REDACTED].

The pulmonary veins drained normally to the left atrium.

Calcium Score: 0 Agatston units.

Coronary Arteries: Right dominant with no anomalies

LM: No plaque or stenosis.

LAD system:  No plaque or stenosis.

Circumflex system: No plaque or stenosis.

RCA system:  No plaque or stenosis.
IMPRESSION: 1. Coronary artery calcium score 0 Agatston units, suggesting low
risk for future cardiac events.

2.  No significant coronary disease noted.

Roverto Gm

ADDENDUM:
The coronary tree is actually left dominant, the RCA is
small/nondominant and the LCx is large, providing left PDA.

EXAM:
OVER-READ INTERPRETATION  CT CHEST

The following report is an over-read performed by radiologist Dr.
Rackchun Dothuong [REDACTED] on 06/11/2018. This over-read
does not include interpretation of cardiac or coronary anatomy or
pathology. The coronary CTA interpretation by the cardiologist is
attached.
FINDINGS: Vascular: Heart is normal size.  Visualized aorta normal caliber.

Mediastinum/Nodes: No adenopathy in the lower mediastinum or hila.

Lungs/Pleura: Patchy ground-glass airspace opacities in the right
upper lobe and right middle lobe. Remainder of the visualized lungs
clear. No effusions.

Upper Abdomen: Imaging into the upper abdomen shows no acute
findings.

Musculoskeletal: Chest wall soft tissues are unremarkable. No acute
bony abnormality.
IMPRESSION: Patchy ground-glass airspace disease in the right middle lobe and
right upper lobe concerning for infectious or inflammatory process.

## 2020-07-01 ENCOUNTER — Other Ambulatory Visit: Payer: Self-pay | Admitting: Allergy & Immunology

## 2020-07-16 ENCOUNTER — Other Ambulatory Visit: Payer: Self-pay

## 2020-07-16 ENCOUNTER — Ambulatory Visit (INDEPENDENT_AMBULATORY_CARE_PROVIDER_SITE_OTHER): Payer: 59 | Admitting: Podiatry

## 2020-07-16 ENCOUNTER — Encounter: Payer: Self-pay | Admitting: Podiatry

## 2020-07-16 DIAGNOSIS — L600 Ingrowing nail: Secondary | ICD-10-CM

## 2020-07-16 MED ORDER — NEOMYCIN-POLYMYXIN-HC 3.5-10000-1 OT SOLN: OTIC | 1 refills | Status: AC

## 2020-07-16 NOTE — Patient Instructions (Signed)

## 2020-07-17 NOTE — Progress Notes (Signed)
Subjective:   Patient ID: Andrew Nolan, male   DOB: 59 y.o.   MRN: [id number]   HPI Patient presents stating he has had a long-term thick damaged right big toenail that he has tried different treatments for but it is becoming more of an issue for him and started to become painful with shoe gear and different activities.  States it is been ongoing and patient does like to be active and does not currently smoke   Review of Systems  All other systems reviewed and are negative.       Objective:  Physical Exam Vitals and nursing note reviewed.  Constitutional:      Appearance: He is well-developed.  Pulmonary:     Effort: Pulmonary effort is normal.  Musculoskeletal:        General: Normal range of motion.  Skin:    General: Skin is warm.  Neurological:     Mental Status: He is alert.     Neurovascular status found to be intact muscle strength was found to be adequate range of motion within normal limits.  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  Due to the way that this is affecting him and the pain he is experiencing I recommended permanent nail removal educated him on this and he wants this done and signed consent form.  I went ahead today and I infiltrated the right hallux 60 mg Xylocaine Marcaine mixture sterile prep done and using sterile instrumentation remove the hallux nail exposed matrix and applied phenol 5 applications 30 seconds followed by alcohol lavage and sterile dressing.  Gave instructions on soaks and  to leave bandage on 24 hours but take it off earlier if any throbbing were to occur and will be seen back to recheck and is encouraged to call with questions concerns

## 2020-08-10 ENCOUNTER — Other Ambulatory Visit: Payer: Self-pay | Admitting: *Deleted

## 2020-08-10 MED ORDER — XHANCE 93 MCG/ACT NA EXHU: 2.0000 | INHALANT_SUSPENSION | Freq: Two times a day (BID) | NASAL | 0 refills | Status: AC

## 2020-08-12 ENCOUNTER — Other Ambulatory Visit: Payer: Self-pay

## 2021-02-15 ENCOUNTER — Other Ambulatory Visit (HOSPITAL_COMMUNITY): Payer: Self-pay | Admitting: *Deleted

## 2021-02-17 ENCOUNTER — Other Ambulatory Visit: Payer: Self-pay

## 2021-02-17 ENCOUNTER — Encounter (HOSPITAL_COMMUNITY)
Admission: RE | Admit: 2021-02-17 | Discharge: 2021-02-17 | Disposition: A | Discharge status: Home / Self Care | Payer: 59 | Source: Ambulatory Visit | Attending: Internal Medicine | Admitting: Internal Medicine

## 2021-02-17 DIAGNOSIS — D649 Anemia, unspecified: Secondary | ICD-10-CM | POA: Insufficient documentation

## 2021-02-17 MED ORDER — SODIUM CHLORIDE 0.9 % IV SOLN
510.0000 mg | INTRAVENOUS | Status: DC
  Administered 2021-02-17: 09:00:00 510 mg via INTRAVENOUS
  Filled 2021-02-17: qty 510

## 2021-02-24 ENCOUNTER — Encounter (HOSPITAL_COMMUNITY): Payer: 59

## 2021-02-28 ENCOUNTER — Other Ambulatory Visit: Payer: Self-pay

## 2021-02-28 ENCOUNTER — Encounter (HOSPITAL_COMMUNITY)
Admission: RE | Admit: 2021-02-28 | Discharge: 2021-02-28 | Disposition: A | Discharge status: Home / Self Care | Payer: 59 | Source: Ambulatory Visit | Attending: Internal Medicine | Admitting: Internal Medicine

## 2021-02-28 DIAGNOSIS — D649 Anemia, unspecified: Secondary | ICD-10-CM | POA: Diagnosis not present

## 2021-02-28 MED ORDER — SODIUM CHLORIDE 0.9 % IV SOLN
510.0000 mg | INTRAVENOUS | Status: DC
  Administered 2021-02-28: 09:00:00 510 mg via INTRAVENOUS
  Filled 2021-02-28: qty 510

## 2021-03-17 ENCOUNTER — Encounter (HOSPITAL_COMMUNITY): Payer: 59

## 2021-03-25 DIAGNOSIS — H9313 Tinnitus, bilateral: Secondary | ICD-10-CM | POA: Insufficient documentation

## 2021-03-27 NOTE — Patient Instructions (Addendum)
1. Seasonal and perennial allergic (grasses, ragweed, weeds, trees, molds, and dust mites  Start taking: Atrovent 0.03% using 2 sprays each nostril twice a day as needed for drainage down throat Stop taking: azelastine nasal spray - continue taking: Claritin(loratadine) '10mg'$  tablet once daily and Xhance (fluticasone) 1-2 sprays per nostril daily  - You can use an extra dose of the antihistamine, if needed, for breakthrough symptoms.  - Consider nasal saline rinses 1-2 times daily to remove allergens from the nasal cavities as well as help with mucous clearance (this is especially helpful to do before the nasal sprays are given) - Consider allergy shots as a means of long-term control. Information given. - Allergy shots "re-train" and "reset" the immune system to ignore environmental allergens and decrease the resulting immune response to those allergens (sneezing, itchy watery eyes, runny nose, nasal congestion, etc).    - Allergy shots improve symptoms in 75-85% of patients.  - We can discuss more at the next appointment if the medications are not working for you. You can call our office if you would like to start allergy injections. You will need to schedule an appointment 2-3 weeks out to start  Tinnitus Continue to follow up with ENT  Please let us know if this treatment plan is not working well for you. Schedule a follow up appointment in 3 months or sooner if needed

## 2021-03-28 ENCOUNTER — Other Ambulatory Visit: Payer: Self-pay

## 2021-03-28 ENCOUNTER — Ambulatory Visit: Payer: 59 | Admitting: Family

## 2021-03-28 ENCOUNTER — Encounter: Payer: Self-pay | Admitting: Family

## 2021-03-28 VITALS — BP 130/80 | HR 71 | Temp 97.9°F | Resp 18 | Ht 71.0 in | Wt 226.6 lb

## 2021-03-28 DIAGNOSIS — J3089 Other allergic rhinitis: Secondary | ICD-10-CM | POA: Diagnosis not present

## 2021-03-28 DIAGNOSIS — J302 Other seasonal allergic rhinitis: Secondary | ICD-10-CM | POA: Diagnosis not present

## 2021-03-28 MED ORDER — IPRATROPIUM BROMIDE 0.03 % NA SOLN: 2.0000 | Freq: Two times a day (BID) | NASAL | 5 refills | Status: AC | PRN

## 2021-03-28 NOTE — Progress Notes (Signed)
[address] [place] [address] Dept: [phone]  FOLLOW UP NOTE  Patient ID: Andrew Nolan, male    DOB: Jan 24, 1961  Age: 60 y.o. MRN: [id number] Date of Office Visit: 03/28/2021  Assessment  Chief Complaint: Allergic Rhinitis   HPI PEACE SCHLECHTER is a 60 year old male who presents today for follow-up of seasonal and perennial allergic rhinitis.  He was last seen on December 09, 2019 by Dr. Ernst Bowler.    Seasonal and perennial allergic rhinitis is reported as not well controlled with Xhance nasal spray 2 sprays each nostril twice a day, Claritin 10 mg once a day, and Sudafed and Tylenol as needed.  He did not like azelastine nasal spray and reports that "it was very powerful and like squirting acid".  He has tried Xyzal, Claritin, Administrator.  He reports nasal congestion in the morning, sinus pressure, sinus headaches, ringing in the ears, occasional pain in ears, and little bit of postnasal drip.  He will use Sudafed or Tylenol once a week and this will help relieve some of the pressure, but reports that his primary care physician does not want him to take it due to his blood pressure.  He does not feel like Truett Perna has made a difference in his symptoms.  He also reports that around 4 months ago he began having tinnitus.  He saw ENT.  He reports that he recently had a CT scan of his sinuses when he saw ENT.  He does not wish to have surgery at this time.  His sinus CT shows: "CLINICAL DATA:  Head pain and pressure   EXAM:  CT PARANASAL SINUSES WITHOUT CONTRAST   TECHNIQUE:  Multidetector CT images of the paranasal sinuses were obtained using  the standard protocol without intravenous contrast.   COMPARISON:  None.   FINDINGS:  Paranasal sinuses:   Frontal: Normally aerated. Patent frontal sinus drainage pathways.   Ethmoid: Normally aerated.   Maxillary: Mild mucosal thickening   Sphenoid: Normally aerated. Patent sphenoethmoidal recesses.   Right ostiomeatal unit:  Patent.   Left ostiomeatal unit: Patent.   Nasal passages: Patent. Rightward septal deviation with small  osseous spur contacting the right inferior turbinate.   Other: Technique does not allow for diagnostic visualization of the  orbital, intracranial, and soft tissue structures.   IMPRESSION:  1. Mild maxillary sinus mucosal thickening. Patent sinus drainage  pathways.  2. Rightward septal deviation with small osseous spur contacting the  right inferior turbinate.    Electronically Signed    By: Ulyses Jarred M.D.    On: 03/25/2021 21:09 "   Drug Allergies:  Allergies  Allergen Reactions   Codeine     REACTION: itching   Yellow Jacket Venom [Bee Venom] Hives and Itching    Review of Systems: Review of Systems  Constitutional:  Negative for chills and fever.  HENT:         Reports ringing in ears at times, sinus pressure, sinus headaches, nasal congestion in the morning and postnasal drip.  Denies rhinorrhea.    Eyes:        Reports occasional itchy watery eyes for which Pataday helps  Respiratory:  Negative for cough, shortness of breath and wheezing.   Cardiovascular:  Negative for chest pain and palpitations.  Gastrointestinal:        Reports reflux for which omeprazole twice a day help  Genitourinary:  Negative for dysuria.  Skin:  Negative for itching and rash.  Neurological:  Positive for  headaches.       Reports sinus headaches  Endo/Heme/Allergies:  Positive for environmental allergies.    Physical Exam: BP 130/80   Pulse 71   Temp 97.9 F (36.6 C)   Resp 18   Ht '5\' 11"'$  (1.803 m)   Wt 226 lb 9.6 oz (102.8 kg)   SpO2 96%   BMI 31.60 kg/m    Physical Exam Constitutional:      Appearance: Normal appearance.  HENT:     Head: Normocephalic and atraumatic.     Comments: Pharynx normal, eyes normal, ears normal, nose: Bilateral lower turbinates mildly edematous with no drainage noted    Right Ear: Tympanic membrane, ear canal and external ear  normal.     Left Ear: Tympanic membrane, ear canal and external ear normal.  Eyes:     Conjunctiva/sclera: Conjunctivae normal.  Cardiovascular:     Rate and Rhythm: Normal rate and regular rhythm.     Heart sounds: Normal heart sounds.  Pulmonary:     Effort: Pulmonary effort is normal.     Breath sounds: Normal breath sounds.     Comments: Lungs clear to auscultation Musculoskeletal:     Cervical back: Neck supple.  Skin:    General: Skin is warm.  Neurological:     Mental Status: He is alert and oriented to person, place, and time.  Psychiatric:        Mood and Affect: Mood normal.        Behavior: Behavior normal.        Thought Content: Thought content normal.        Judgment: Judgment normal.    Diagnostics: None  Assessment and Plan: 1. Seasonal and perennial allergic rhinitis     Meds ordered this encounter  Medications   ipratropium (ATROVENT) 0.03 % nasal spray    Sig: Place 2 sprays into both nostrils 2 (two) times daily as needed for rhinitis.    Dispense:  30 mL    Refill:  5    Patient Instructions  1. Seasonal and perennial allergic (grasses, ragweed, weeds, trees, molds, and dust mites  Start taking: Atrovent 0.03% using 2 sprays each nostril twice a day as needed for drainage down throat Stop taking: azelastine nasal spray - continue taking: Claritin(loratadine) '10mg'$  tablet once daily and Xhance (fluticasone) 1-2 sprays per nostril daily  - You can use an extra dose of the antihistamine, if needed, for breakthrough symptoms.  - Consider nasal saline rinses 1-2 times daily to remove allergens from the nasal cavities as well as help with mucous clearance (this is especially helpful to do before the nasal sprays are given) - Consider allergy shots as a means of long-term control. Information given. - Allergy shots "re-train" and "reset" the immune system to ignore environmental allergens and decrease the resulting immune response to those allergens  (sneezing, itchy watery eyes, runny nose, nasal congestion, etc).    - Allergy shots improve symptoms in 75-85% of patients.  - We can discuss more at the next appointment if the medications are not working for you. You can call our office if you would like to start allergy injections. You will need to schedule an appointment 2-3 weeks out to start  Tinnitus Continue to follow up with ENT  Please let us know if this treatment plan is not working well for you. Schedule a follow up appointment in 3 months or sooner if needed  Return in about 3 months (around 06/28/2021), or if symptoms worsen or fail to improve.    Thank you for the opportunity to care for this patient.  Please do not hesitate to contact me with questions.  Althea Charon, FNP [facility]

## 2021-07-13 ENCOUNTER — Telehealth: Payer: Self-pay | Admitting: Hematology and Oncology

## 2021-07-13 NOTE — Telephone Encounter (Signed)
Scheduled appt per 11/9 referral. Pt is aware of appt date and time.  

## 2021-07-15 ENCOUNTER — Other Ambulatory Visit: Payer: Self-pay

## 2021-07-15 ENCOUNTER — Other Ambulatory Visit: Payer: Self-pay | Admitting: Internal Medicine

## 2021-07-15 ENCOUNTER — Encounter: Payer: Self-pay | Admitting: Hematology and Oncology

## 2021-07-15 ENCOUNTER — Inpatient Hospital Stay: Payer: 59

## 2021-07-15 ENCOUNTER — Inpatient Hospital Stay: Payer: 59 | Attending: Hematology and Oncology | Admitting: Hematology and Oncology

## 2021-07-15 DIAGNOSIS — E6609 Other obesity due to excess calories: Secondary | ICD-10-CM | POA: Insufficient documentation

## 2021-07-15 DIAGNOSIS — I10 Essential (primary) hypertension: Secondary | ICD-10-CM | POA: Diagnosis not present

## 2021-07-15 DIAGNOSIS — D751 Secondary polycythemia: Secondary | ICD-10-CM | POA: Diagnosis not present

## 2021-07-15 DIAGNOSIS — Z6830 Body mass index (BMI) 30.0-30.9, adult: Secondary | ICD-10-CM | POA: Insufficient documentation

## 2021-07-15 DIAGNOSIS — D170 Benign lipomatous neoplasm of skin and subcutaneous tissue of head, face and neck: Secondary | ICD-10-CM

## 2021-07-15 NOTE — Progress Notes (Signed)
Per Dr. Alvy Bimler - okay to proceed with phlebotomy today without labs.  Last labs from PCP indicated hemoglobin of 19 on 11/9.

## 2021-07-15 NOTE — Progress Notes (Signed)
[facility] CONSULT NOTE  Patient Care Team: Sueanne Margarita, DO as PCP - General (Internal Medicine)   ASSESSMENT & PLAN Secondary erythrocytosis The patient had extensive evaluation by his primary care doctor He was found to have elevated erythropoietin level which is most consistent with secondary erythrocytosis, most likely secondary to untreated obstructive sleep apnea We discussed potential repeat testing and to rule out myeloproliferative disorder We also discussed conservative approach with phlebotomy to keep hemoglobin under 17 g along with aggressive lifestyle modification with weight loss, exercise and hydration Ultimately, the patient elected for conservative approach I recommend 1 unit of phlebotomy today and repeat monthly to keep hemoglobin under 17 g The patient is instructed to drink plenty of fluid, approximately 100 ounces of liquid per day We also discussed the benefit of 81 mg aspirin   Class 1 obesity due to excess calories with body mass index (BMI) of 30.0 to 30.9 in adult The patient acknowledged that he needs to address lifestyle and weight He appears motivated to start exercising with goal to lose some weight before his next visit to see me  Hypertension Additional blood pressure medication was added due to poorly controlled hypertension I am hopeful once were able to get his hemoglobin down, his blood pressure will normalize, along with lifestyle changes I recommend he continue to check his blood pressure regularly  Orders Placed This Encounter  Procedures   CBC with Differential/Platelet    Standing Status:   Standing    Number of Occurrences:   22    Standing Expiration Date:   07/15/2022     The total time spent in the appointment was 55 minutes encounter with patients including review of chart and various tests results, discussions about plan of care and coordination of care plan   All questions were answered. The patient knows to  call the clinic with any problems, questions or concerns. No barriers to learning was detected.  Heath Lark, MD 11/11/20224:23 PM   CHIEF COMPLAINTS/PURPOSE OF CONSULTATION:  Erythrocytosis  HISTORY OF PRESENTING ILLNESS:  Andrew Nolan 60 y.o. male is here because of elevated hemoglobin.  He was found to have abnormal CBC from routine blood work by primary care doctor The patient has been diagnosed with secondary polycythemia for approximately 15 years Typically, his hemoglobin runs around 18 range He was never diagnosed with blood clots He has been told he needs to donate blood on a regular basis Earlier this year, his hemoglobin went down too much and he received 2 doses of IV iron and that led to increased blood count again His latest hemoglobin was over 19.  According to scanned records from his primary care doctor's office, on July 06, 2021, his hemoglobin was 19.3 with hematocrit of 53.6 He has occasional rare headaches.  He is known to have nasal passage septal deviation and mucosal hypertrophy and possibly undiagnosed obstructive sleep apnea The patient acknowledged he has gained a lot of weight over the past few years. He denies intermittent headaches, shortness of breath on exertion, frequent leg cramps and occasional chest pain.  He never suffer from diagnosis of blood clot.  He does not smoke.  He does not use testosterone replacement products  MEDICAL HISTORY:  Past Medical History:  Diagnosis Date   Allergy    Arthritis    ECRB (extensor carpi radialis brevis) tenosynovitis    GERD (gastroesophageal reflux disease)    occasional    Hyperlipidemia    Hypertension  Lateral epicondylitis of right elbow    Medial epicondylitis of right elbow    PONV (postoperative nausea and vomiting)    only with one surgery in 2005, no other problems with other surgeries   Shingles    Wears glasses     SURGICAL HISTORY: Past Surgical History:  Procedure Laterality Date    COLONOSCOPY  06/17/2009   dr Deatra Ina    FRACTURE SURGERY     lt thumb-age 54   LATERAL EPICONDYLE RELEASE Right 11/11/2013   Procedure: RIGHT TENOTOMY ELBOW LATERAL EPICONDYLITIS TENNIS ELBOW/RIGHT ELBOW INJECTION ASPIRATION ARTHROCENTESIS INTERMEDULLARY JOINT BURSA;  Surgeon: Lorn Junes, MD;  Location: [facility];  Service: Orthopedics;  Laterality: Right;   ORIF METACARPAL FRACTURE  1982   right hand   ORIF RADIUS & ULNA FRACTURES  1976   compound-right   ORIF WRIST FRACTURE  1975   left   SHOULDER ARTHROSCOPY W/ LABRAL REPAIR  1999   right   SHOULDER ARTHROSCOPY W/ ROTATOR CUFF REPAIR  2005   left   UPPER GI ENDOSCOPY     VASECTOMY     WISDOM TOOTH EXTRACTION      SOCIAL HISTORY: Social History   Socioeconomic History   Marital status: Married    Spouse name: Not on file   Number of children: Not on file   Years of education: Not on file   Highest education level: Not on file  Occupational History   Not on file  Tobacco Use   Smoking status: Former    Packs/day: 0.50    Years: 12.00    Pack years: 6.00    Types: Cigarettes    Quit date: 10/29/1989    Years since quitting: 31.7   Smokeless tobacco: Never  Vaping Use   Vaping Use: Never used  Substance and Sexual Activity   Alcohol use: Yes    Alcohol/week: 10.0 standard drinks    Types: 10 Shots of liquor per week   Drug use: No   Sexual activity: Yes    Birth control/protection: None  Other Topics Concern   Not on file  Social History Narrative   Not on file   Social Determinants of Health   Financial Resource Strain: Not on file  Food Insecurity: Not on file  Transportation Needs: Not on file  Physical Activity: Not on file  Stress: Not on file  Social Connections: Not on file  Intimate Partner Violence: Not on file    FAMILY HISTORY: Family History  Problem Relation Age of Onset   Heart attack Mother 107       deceased   Heart attack Father 49   Colon cancer Neg Hx     Rectal cancer Neg Hx    Stomach cancer Neg Hx     ALLERGIES:  is allergic to codeine and yellow jacket venom [bee venom].  MEDICATIONS:  Current Outpatient Medications  Medication Sig Dispense Refill   loratadine (CLARITIN) 10 MG tablet Take 10 mg by mouth daily.     Fluticasone Propionate (XHANCE) 93 MCG/ACT EXHU Place 2 sprays into the nose in the morning and at bedtime. 32 mL 0   halobetasol (ULTRAVATE) 0.05 % ointment      ipratropium (ATROVENT) 0.03 % nasal spray Place 2 sprays into both nostrils 2 (two) times daily as needed for rhinitis. 30 mL 5   losartan (COZAAR) 100 MG tablet Take 100 mg by mouth daily.     neomycin-polymyxin-hydrocortisone (CORTISPORIN) OTIC solution Apply 1-2 drops to toe  after soaking BID 10 mL 1   omeprazole (PRILOSEC) 20 MG capsule Take 20 mg by mouth 2 (two) times daily before a meal.     sildenafil (VIAGRA) 50 MG tablet      No current facility-administered medications for this visit.    REVIEW OF SYSTEMS:   Constitutional: Denies fevers, chills or abnormal night sweats Eyes: Denies blurriness of vision, double vision or watery eyes Ears, nose, mouth, throat, and face: Denies mucositis or sore throat Respiratory: Denies cough, dyspnea or wheezes Cardiovascular: Denies palpitation, chest discomfort or lower extremity swelling Gastrointestinal:  Denies nausea, heartburn or change in bowel habits Skin: Denies abnormal skin rashes Lymphatics: Denies new lymphadenopathy or easy bruising Neurological:Denies numbness, tingling or new weaknesses Behavioral/Psych: Mood is stable, no new changes  All other systems were reviewed with the patient and are negative.  PHYSICAL EXAMINATION: ECOG PERFORMANCE STATUS: 1 - Symptomatic but completely ambulatory  Vitals:   07/15/21 1307  BP: (!) 153/82  Pulse: 81  Resp: 18  Temp: 98 F (36.7 C)  SpO2: 98%   Filed Weights   07/15/21 1307  Weight: 225 lb 9.6 oz (102.3 kg)    GENERAL:alert, no distress and  comfortable SKIN: skin color, texture, turgor are normal, no rashes or significant lesions EYES: normal, conjunctiva are pink and non-injected, sclera clear OROPHARYNX:no exudate, no erythema and lips, buccal mucosa, and tongue normal  NECK: supple, thyroid normal size, non-tender, without nodularity.  Noted thick neck LYMPH:  no palpable lymphadenopathy in the cervical, axillary or inguinal LUNGS: clear to auscultation and percussion with normal breathing effort HEART: regular rate & rhythm and no murmurs and no lower extremity edema ABDOMEN:abdomen soft, non-tender and normal bowel sounds Musculoskeletal:no cyanosis of digits and no clubbing  PSYCH: alert & oriented x 3 with fluent speech NEURO: no focal motor/sensory deficits  LABORATORY DATA:  Lab Results  Component Value Date   HGB 18.1 (H) 11/11/2013

## 2021-07-15 NOTE — Assessment & Plan Note (Signed)
The patient had extensive evaluation by his primary care doctor He was found to have elevated erythropoietin level which is most consistent with secondary erythrocytosis, most likely secondary to untreated obstructive sleep apnea We discussed potential repeat testing and to rule out myeloproliferative disorder We also discussed conservative approach with phlebotomy to keep hemoglobin under 17 g along with aggressive lifestyle modification with weight loss, exercise and hydration Ultimately, the patient elected for conservative approach I recommend 1 unit of phlebotomy today and repeat monthly to keep hemoglobin under 17 g The patient is instructed to drink plenty of fluid, approximately 100 ounces of liquid per day We also discussed the benefit of 81 mg aspirin

## 2021-07-15 NOTE — Assessment & Plan Note (Signed)
The patient acknowledged that he needs to address lifestyle and weight He appears motivated to start exercising with goal to lose some weight before his next visit to see me

## 2021-07-15 NOTE — Assessment & Plan Note (Signed)
Additional blood pressure medication was added due to poorly controlled hypertension I am hopeful once were able to get his hemoglobin down, his blood pressure will normalize, along with lifestyle changes I recommend he continue to check his blood pressure regularly

## 2021-07-15 NOTE — Progress Notes (Unsigned)
Scheduled for phleb procedure today, pt misunderstood and left for the day. Message sent to MD and scheduling.

## 2021-07-18 ENCOUNTER — Telehealth: Payer: Self-pay | Admitting: *Deleted

## 2021-07-18 NOTE — Telephone Encounter (Signed)
Per Dr. Alvy Bimler -- Informed Mr. Tavano if he could donate blood this week to call office and let us know --  appts for phlebotomy and labs on 11/22 can be cancelled.  Mr. Ursua said he will call office once he has donated.

## 2021-07-19 ENCOUNTER — Encounter: Payer: Self-pay | Admitting: Hematology and Oncology

## 2021-07-19 ENCOUNTER — Ambulatory Visit
Admission: RE | Admit: 2021-07-19 | Discharge: 2021-07-19 | Disposition: A | Discharge status: Home / Self Care | Payer: 59 | Source: Ambulatory Visit | Attending: Internal Medicine | Admitting: Internal Medicine

## 2021-07-19 DIAGNOSIS — D170 Benign lipomatous neoplasm of skin and subcutaneous tissue of head, face and neck: Secondary | ICD-10-CM

## 2021-07-19 NOTE — Telephone Encounter (Signed)
Patient called - he donated blood w/[facility] on 07/18/21. Appts on 11/22 cancelled.  Also - B/P today 115/81 Rt arm, 122/83 Lt arm.  He said Dr. Alvy Bimler discussed him holding the HCTZ started last week by PCP. Patient wants to know if he should contact his PCP regarding this?  Call information and question routed to Dr. Alvy Bimler.

## 2021-07-19 NOTE — Telephone Encounter (Signed)
His BP will stay down if we can control his hemoglobin I leave the decision up to him whether he wants to call his PCP or not

## 2021-07-19 NOTE — Telephone Encounter (Signed)
Contacted patient with Dr. Calton Dach response. Patient verbalized understanding

## 2021-07-26 ENCOUNTER — Other Ambulatory Visit: Payer: 59

## 2021-08-15 ENCOUNTER — Inpatient Hospital Stay: Payer: 59 | Attending: Hematology and Oncology

## 2021-08-15 ENCOUNTER — Other Ambulatory Visit: Payer: Self-pay

## 2021-08-15 ENCOUNTER — Inpatient Hospital Stay: Payer: 59

## 2021-08-15 VITALS — BP 127/78 | HR 68 | Temp 98.8°F | Resp 17 | Wt 226.4 lb

## 2021-08-15 DIAGNOSIS — D751 Secondary polycythemia: Secondary | ICD-10-CM | POA: Insufficient documentation

## 2021-08-15 LAB — CBC WITH DIFFERENTIAL/PLATELET
Abs Immature Granulocytes: 0.03 10*3/uL (ref 0.00–0.07)
Basophils Absolute: 0.1 10*3/uL (ref 0.0–0.1)
Basophils Relative: 1 %
Eosinophils Absolute: 0.2 10*3/uL (ref 0.0–0.5)
Eosinophils Relative: 2 %
HCT: 51.1 % (ref 39.0–52.0)
Hemoglobin: 18.5 g/dL — ABNORMAL HIGH (ref 13.0–17.0)
Immature Granulocytes: 0 %
Lymphocytes Relative: 29 %
Lymphs Abs: 2.5 10*3/uL (ref 0.7–4.0)
MCH: 30.9 pg (ref 26.0–34.0)
MCHC: 36.2 g/dL — ABNORMAL HIGH (ref 30.0–36.0)
MCV: 85.3 fL (ref 80.0–100.0)
Monocytes Absolute: 0.8 10*3/uL (ref 0.1–1.0)
Monocytes Relative: 9 %
Neutro Abs: 5.2 10*3/uL (ref 1.7–7.7)
Neutrophils Relative %: 59 %
Platelets: 226 10*3/uL (ref 150–400)
RBC: 5.99 MIL/uL — ABNORMAL HIGH (ref 4.22–5.81)
RDW: 12.8 % (ref 11.5–15.5)
WBC: 8.9 10*3/uL (ref 4.0–10.5)
nRBC: 0 % (ref 0.0–0.2)

## 2021-08-15 NOTE — Patient Instructions (Signed)

## 2021-08-15 NOTE — Progress Notes (Signed)
Andrew Nolan presents today for phlebotomy per MD orders. Phlebotomy procedure started at 10:09 and ended at 10:15. 16G phlebotomy kit used.  515 grams removed. Patient observed for 30 minutes after procedure without any incident. Patient tolerated procedure well. Food and beverage offered. Pt accepted water.  IV needle removed intact. VSS at discharge.

## 2021-08-25 ENCOUNTER — Other Ambulatory Visit: Payer: 59

## 2021-09-29 ENCOUNTER — Inpatient Hospital Stay: Payer: 59

## 2021-09-29 ENCOUNTER — Inpatient Hospital Stay: Payer: 59 | Attending: Hematology and Oncology

## 2021-09-29 ENCOUNTER — Other Ambulatory Visit: Payer: Self-pay

## 2021-09-29 ENCOUNTER — Inpatient Hospital Stay: Payer: 59 | Admitting: Hematology and Oncology

## 2021-09-29 VITALS — BP 111/83 | HR 76 | Temp 98.4°F | Resp 17 | Wt 228.5 lb

## 2021-09-29 DIAGNOSIS — D751 Secondary polycythemia: Secondary | ICD-10-CM

## 2021-09-29 LAB — CBC WITH DIFFERENTIAL/PLATELET
Abs Immature Granulocytes: 0.02 10*3/uL (ref 0.00–0.07)
Basophils Absolute: 0.1 10*3/uL (ref 0.0–0.1)
Basophils Relative: 1 %
Eosinophils Absolute: 0.2 10*3/uL (ref 0.0–0.5)
Eosinophils Relative: 2 %
HCT: 49.3 % (ref 39.0–52.0)
Hemoglobin: 18.4 g/dL — ABNORMAL HIGH (ref 13.0–17.0)
Immature Granulocytes: 0 %
Lymphocytes Relative: 29 %
Lymphs Abs: 2.6 10*3/uL (ref 0.7–4.0)
MCH: 31.1 pg (ref 26.0–34.0)
MCHC: 37.3 g/dL — ABNORMAL HIGH (ref 30.0–36.0)
MCV: 83.3 fL (ref 80.0–100.0)
Monocytes Absolute: 0.9 10*3/uL (ref 0.1–1.0)
Monocytes Relative: 10 %
Neutro Abs: 5.3 10*3/uL (ref 1.7–7.7)
Neutrophils Relative %: 58 %
Platelets: 241 10*3/uL (ref 150–400)
RBC: 5.92 MIL/uL — ABNORMAL HIGH (ref 4.22–5.81)
RDW: 12.7 % (ref 11.5–15.5)
WBC: 9.2 10*3/uL (ref 4.0–10.5)
nRBC: 0 % (ref 0.0–0.2)

## 2021-09-29 NOTE — Patient Instructions (Signed)
Therapeutic Phlebotomy °Therapeutic phlebotomy is the planned removal of blood from a person's body for the purpose of treating a medical condition. The procedure is lot like donating blood. Usually, about a pint (470 mL, or 0.47 L) of blood is removed. The average adult has 9-12 pints (4.3-5.7 L) of blood in his or her body. °Therapeutic phlebotomy may be used to treat the following medical conditions: °Hemochromatosis. This is a condition in which the blood contains too much iron. °Polycythemia vera. This is a condition in which the blood contains too many red blood cells. °Porphyria cutanea tarda. This is a disease in which an important part of hemoglobin is not made properly. It results in the buildup of abnormal amounts of porphyrins in the body. °Sickle cell disease. This is a condition in which the red blood cells form an abnormal crescent shape rather than a round shape. °Tell a health care provider about: °Any allergies you have. °All medicines you are taking, including vitamins, herbs, eye drops, creams, and over-the-counter medicines. °Any bleeding problems you have. °Any surgeries you have had. °Any medical conditions you have. °Whether you are pregnant or may be pregnant. °What are the risks? °Generally, this is a safe procedure. However, problems may occur, including: °Nausea or light-headedness. °Low blood pressure (hypotension). °Soreness, bleeding, swelling, or bruising at the needle insertion site. °Infection. °What happens before the procedure? °Ask your health care provider about: °Changing or stopping your regular medicines. This is especially important if you are taking diabetes medicines or blood thinners. °Taking medicines such as aspirin and ibuprofen. These medicines can thin your blood. Do not take these medicines unless your health care provider tells you to take them. °Taking over-the-counter medicines, vitamins, herbs, and supplements. °Wear clothing with sleeves that can be raised  above the elbow. °You may have a blood sample taken. °Your blood pressure, pulse rate, and breathing rate will be measured. °What happens during the procedure? ° °You may be given a medicine to numb the area (local anesthetic). °A tourniquet will be placed on your arm. °A needle will be put into one of your veins. °Tubing and a collection bag will be attached to the needle. °Blood will flow through the needle and tubing into the collection bag. °The collection bag will be placed lower than your arm so gravity can help the blood flow into the bag. °You may be asked to open and close your hand slowly and continually during the entire collection. °After the specified amount of blood has been removed from your body, the collection bag and tubing will be clamped. °The needle will be removed from your vein. °Pressure will be held on the needle site to stop the bleeding. °A bandage (dressing) will be placed over the needle insertion site. °The procedure may vary among health care providers and hospitals. °What happens after the procedure? °Your blood pressure, pulse rate, and breathing rate will be measured after the procedure. °You will be encouraged to drink fluids. °You will be encouraged to eat a snack to prevent a low blood sugar level. °Your recovery will be assessed and monitored. °Return to your normal activities as told by your health care provider. °Summary °Therapeutic phlebotomy is the planned removal of blood from a person's body for the purpose of treating a medical condition. °Therapeutic phlebotomy may be used to treat hemochromatosis, polycythemia vera, porphyria cutanea tarda, or sickle cell disease. °In the procedure, a needle is inserted and about a pint (470 mL, or 0.47 L) of blood is   removed. The average adult has 9-12 pints (4.3-5.7 L) of blood in the body. °This is generally a safe procedure, but it can sometimes cause problems such as nausea, light-headedness, or low blood pressure  (hypotension). °This information is not intended to replace advice given to you by your health care provider. Make sure you discuss any questions you have with your health care provider. °Document Revised: 02/16/2021 Document Reviewed: 02/16/2021 °Elsevier Patient Education © 2022 [employer] ° °

## 2021-09-29 NOTE — Progress Notes (Signed)
Pt presented for therapeutic phlebotomy per MD orders. Procedure started at 0948 and ended at 0954, 532g obtained,16g kit used to LAC, pt tolerated well. Food and drink offered, pt declined AVS. Pt discharged in stable condition. Ambulatory to lobby, VSS after 30 min of observation.

## 2021-12-29 ENCOUNTER — Other Ambulatory Visit: Payer: Self-pay

## 2021-12-29 ENCOUNTER — Inpatient Hospital Stay: Payer: 59

## 2021-12-29 ENCOUNTER — Encounter: Payer: Self-pay | Admitting: Hematology and Oncology

## 2021-12-29 ENCOUNTER — Inpatient Hospital Stay: Payer: 59 | Attending: Hematology and Oncology | Admitting: Hematology and Oncology

## 2021-12-29 DIAGNOSIS — G4733 Obstructive sleep apnea (adult) (pediatric): Secondary | ICD-10-CM | POA: Insufficient documentation

## 2021-12-29 DIAGNOSIS — D751 Secondary polycythemia: Secondary | ICD-10-CM | POA: Insufficient documentation

## 2021-12-29 DIAGNOSIS — E6609 Other obesity due to excess calories: Secondary | ICD-10-CM | POA: Insufficient documentation

## 2021-12-29 DIAGNOSIS — Z6830 Body mass index (BMI) 30.0-30.9, adult: Secondary | ICD-10-CM | POA: Diagnosis not present

## 2021-12-29 DIAGNOSIS — Z79899 Other long term (current) drug therapy: Secondary | ICD-10-CM | POA: Insufficient documentation

## 2021-12-29 LAB — CBC WITH DIFFERENTIAL/PLATELET
Abs Immature Granulocytes: 0.03 10*3/uL (ref 0.00–0.07)
Basophils Absolute: 0.1 10*3/uL (ref 0.0–0.1)
Basophils Relative: 1 %
Eosinophils Absolute: 0.1 10*3/uL (ref 0.0–0.5)
Eosinophils Relative: 2 %
HCT: 45.6 % (ref 39.0–52.0)
Hemoglobin: 16.5 g/dL (ref 13.0–17.0)
Immature Granulocytes: 0 %
Lymphocytes Relative: 27 %
Lymphs Abs: 2.4 10*3/uL (ref 0.7–4.0)
MCH: 28.9 pg (ref 26.0–34.0)
MCHC: 36.2 g/dL — ABNORMAL HIGH (ref 30.0–36.0)
MCV: 80 fL (ref 80.0–100.0)
Monocytes Absolute: 1.4 10*3/uL — ABNORMAL HIGH (ref 0.1–1.0)
Monocytes Relative: 16 %
Neutro Abs: 4.6 10*3/uL (ref 1.7–7.7)
Neutrophils Relative %: 54 %
Platelets: 199 10*3/uL (ref 150–400)
RBC: 5.7 MIL/uL (ref 4.22–5.81)
RDW: 12.9 % (ref 11.5–15.5)
WBC: 8.6 10*3/uL (ref 4.0–10.5)
nRBC: 0 % (ref 0.0–0.2)

## 2021-12-29 NOTE — Assessment & Plan Note (Signed)
The patient donated blood approximately 3 weeks ago ?His last phlebotomy prior to that was in January ?His hemoglobin is now within normal range ?Recommend blood donation as needed every 2 to 3 months ?He does not need additional work-up for long-term follow-up ?We discussed the importance of dietary modification and weight loss.  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

## 2021-12-29 NOTE — Progress Notes (Signed)
? ?[facility] ?OFFICE PROGRESS NOTE ? ?Sueanne Margarita, DO ? ?ASSESSMENT & PLAN:  ?Secondary erythrocytosis ?The patient donated blood approximately 3 weeks ago ?His last phlebotomy prior to that was in January ?His hemoglobin is now within normal range ?Recommend blood donation as needed every 2 to 3 months ?He does not need additional work-up for long-term follow-up ?We discussed the importance of dietary modification and weight loss.  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 ?Class 1 obesity due to excess calories with body mass index (BMI) of 30.0 to 30.9 in adult ?The patient acknowledged that he needs to address lifestyle and weight ?He appears motivated to start exercising with goal to lose some weight ? ?No orders of the defined types were placed in this encounter. ? ? ?The total time spent in the appointment was 20 minutes encounter with patients including review of chart and various tests results, discussions about plan of care and coordination of care plan ? ? All questions were answered. The patient knows to call the clinic with any problems, questions or concerns. No barriers to learning was detected. ? ? ? Heath Lark, MD ?4/27/202310:17 AM ? ?INTERVAL HISTORY: ?HENRICK MCGUE 61 y.o. male returns for follow-up on secondary erythrocytosis ?He feels great ?Denies complications from phlebotomy ?He donated blood again approximately 3-1/2 weeks ago ? ?SUMMARY OF HEMATOLOGIC HISTORY: ? ?He was found to have abnormal CBC from routine blood work by primary care doctor ?The patient has been diagnosed with secondary polycythemia for approximately 15 years ?Typically, his hemoglobin runs around 18 range ?He was never diagnosed with blood clots ?He has been told he needs to donate blood on a regular basis ?Earlier this year, his hemoglobin went down too much and he received 2 doses of IV iron and that led to increased blood count again ?His latest hemoglobin was over  19.  According to scanned records from his primary care doctor's office, on July 06, 2021, his hemoglobin was 19.3 with hematocrit of 53.6 ?He has occasional rare headaches.  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 ?He denies intermittent headaches, shortness of breath on exertion, frequent leg cramps and occasional chest pain.  ?He never suffer from diagnosis of blood clot.  ?He does not smoke.  He does not use testosterone replacement products ?After phlebotomy in January and April 2023, his hemoglobin normalized ? ?I have reviewed the past medical history, past surgical history, social history and family history with the patient and they are unchanged from previous note. ? ?ALLERGIES:  is allergic to codeine and yellow jacket venom [bee venom]. ? ?MEDICATIONS:  ?Current Outpatient Medications  ?Medication Sig Dispense Refill  ? amLODipine (NORVASC) 5 MG tablet Take 5 mg by mouth daily.    ? Fluticasone Propionate (XHANCE) 93 MCG/ACT EXHU Place 2 sprays into the nose in the morning and at bedtime. 32 mL 0  ? halobetasol (ULTRAVATE) 0.05 % ointment     ? hydrochlorothiazide (HYDRODIURIL) 12.5 MG tablet Take 12.5 mg by mouth every morning.    ? ipratropium (ATROVENT) 0.03 % nasal spray Place 2 sprays into both nostrils 2 (two) times daily as needed for rhinitis. 30 mL 5  ? loratadine (CLARITIN) 10 MG tablet Take 10 mg by mouth daily.    ? losartan (COZAAR) 100 MG tablet Take 100 mg  by mouth daily.    ? neomycin-polymyxin-hydrocortisone (CORTISPORIN) OTIC solution Apply 1-2 drops to toe after soaking BID 10 mL 1  ? omeprazole (PRILOSEC) 20 MG capsule Take 20 mg by mouth 2 (two) times daily before a meal.    ? sildenafil (VIAGRA) 50 MG tablet     ? ?No current facility-administered medications for this visit.  ?  ? ?REVIEW OF SYSTEMS:   ?Constitutional:  Denies fevers, chills or night sweats ?Eyes: Denies blurriness of vision ?Ears, nose, mouth, throat, and face: Denies mucositis or sore throat ?Respiratory: Denies cough, dyspnea or wheezes ?Cardiovascular: Denies palpitation, chest discomfort or lower extremity swelling ?Gastrointestinal:  Denies nausea, heartburn or change in bowel habits ?Skin: Denies abnormal skin rashes ?Lymphatics: Denies new lymphadenopathy or easy bruising ?Neurological:Denies numbness, tingling or new weaknesses ?Behavioral/Psych: Mood is stable, no new changes  ?All other systems were reviewed with the patient and are negative. ? ?PHYSICAL EXAMINATION: ?ECOG PERFORMANCE STATUS: 0 - Asymptomatic ? ?Vitals:  ? 12/29/21 0901  ?BP: (!) 146/79  ?Pulse: 73  ?Resp: 18  ?Temp: (!) 97.4 ?F (36.3 ?C)  ?SpO2: 100%  ? ?Filed Weights  ? 12/29/21 0901  ?Weight: 228 lb 3.2 oz (103.5 kg)  ? ? ?GENERAL:alert, no distress and comfortable ?NEURO: alert & oriented x 3 with fluent speech, no focal motor/sensory deficits ? ?LABORATORY DATA:  ?I have reviewed the data as listed ? ?   ?Component Value Date/Time  ? PROT 7.2 12/15/2016 1109  ? ALBUMIN 4.4 12/15/2016 1109  ? AST 17 12/15/2016 1109  ? ALT 27 12/15/2016 1109  ? ALKPHOS 68 12/15/2016 1109  ? BILITOT 0.9 12/15/2016 1109  ? ? ?No results found for: SPEP, UPEP ? ?Lab Results  ?Component Value Date  ? WBC 8.6 12/29/2021  ? NEUTROABS 4.6 12/29/2021  ? HGB 16.5 12/29/2021  ? HCT 45.6 12/29/2021  ? MCV 80.0 12/29/2021  ? PLT 199 12/29/2021  ? ? ?  Chemistry   ?No results found for: NA, K, CL, CO2, BUN, CREATININE, GLU    ?Component Value Date/Time  ? ALKPHOS 68 12/15/2016 1109  ? AST 17 12/15/2016 1109  ? ALT 27 12/15/2016 1109  ? BILITOT 0.9 12/15/2016 1109  ?  ? ? ?

## 2021-12-29 NOTE — Assessment & Plan Note (Signed)
The patient acknowledged that he needs to address lifestyle and weight ?He appears motivated to start exercising with goal to lose some weight ?

## 2022-07-04 ENCOUNTER — Encounter: Payer: Self-pay | Admitting: Gastroenterology

## 2022-07-17 ENCOUNTER — Emergency Department (HOSPITAL_BASED_OUTPATIENT_CLINIC_OR_DEPARTMENT_OTHER): Payer: 59 | Admitting: Radiology

## 2022-07-17 ENCOUNTER — Other Ambulatory Visit: Payer: Self-pay

## 2022-07-17 ENCOUNTER — Emergency Department (HOSPITAL_BASED_OUTPATIENT_CLINIC_OR_DEPARTMENT_OTHER)
Admission: EM | Admit: 2022-07-17 | Discharge: 2022-07-17 | Disposition: A | Discharge status: Home / Self Care | Payer: 59 | Attending: Emergency Medicine | Admitting: Emergency Medicine

## 2022-07-17 ENCOUNTER — Encounter (HOSPITAL_BASED_OUTPATIENT_CLINIC_OR_DEPARTMENT_OTHER): Payer: Self-pay

## 2022-07-17 DIAGNOSIS — Z79899 Other long term (current) drug therapy: Secondary | ICD-10-CM | POA: Insufficient documentation

## 2022-07-17 DIAGNOSIS — R61 Generalized hyperhidrosis: Secondary | ICD-10-CM | POA: Diagnosis not present

## 2022-07-17 DIAGNOSIS — R55 Syncope and collapse: Secondary | ICD-10-CM | POA: Insufficient documentation

## 2022-07-17 DIAGNOSIS — D72829 Elevated white blood cell count, unspecified: Secondary | ICD-10-CM | POA: Diagnosis not present

## 2022-07-17 DIAGNOSIS — R531 Weakness: Secondary | ICD-10-CM | POA: Insufficient documentation

## 2022-07-17 DIAGNOSIS — I10 Essential (primary) hypertension: Secondary | ICD-10-CM | POA: Insufficient documentation

## 2022-07-17 DIAGNOSIS — E876 Hypokalemia: Secondary | ICD-10-CM | POA: Diagnosis not present

## 2022-07-17 DIAGNOSIS — R079 Chest pain, unspecified: Secondary | ICD-10-CM | POA: Diagnosis not present

## 2022-07-17 LAB — URINALYSIS, ROUTINE W REFLEX MICROSCOPIC
Bilirubin Urine: NEGATIVE
Glucose, UA: NEGATIVE mg/dL
Hgb urine dipstick: NEGATIVE
Ketones, ur: NEGATIVE mg/dL
Leukocytes,Ua: NEGATIVE
Nitrite: NEGATIVE
Specific Gravity, Urine: 1.025 (ref 1.005–1.030)
pH: 6 (ref 5.0–8.0)

## 2022-07-17 LAB — BASIC METABOLIC PANEL
Anion gap: 12 (ref 5–15)
BUN: 20 mg/dL (ref 8–23)
CO2: 26 mmol/L (ref 22–32)
Calcium: 9.8 mg/dL (ref 8.9–10.3)
Chloride: 101 mmol/L (ref 98–111)
Creatinine, Ser: 1.14 mg/dL (ref 0.61–1.24)
GFR, Estimated: >60 mL/min (ref >60)
Glucose, Bld: 120 mg/dL — ABNORMAL HIGH (ref 70–99)
Potassium: 3.4 mmol/L — ABNORMAL LOW (ref 3.5–5.1)
Sodium: 139 mmol/L (ref 135–145)

## 2022-07-17 LAB — TROPONIN I (HIGH SENSITIVITY)
Troponin I (High Sensitivity): <2 ng/L (ref <18)
Troponin I (High Sensitivity): 4 ng/L (ref <18)

## 2022-07-17 LAB — CBC
HCT: 46.3 % (ref 39.0–52.0)
Hemoglobin: 15.9 g/dL (ref 13.0–17.0)
MCH: 27.3 pg (ref 26.0–34.0)
MCHC: 34.3 g/dL (ref 30.0–36.0)
MCV: 79.4 fL — ABNORMAL LOW (ref 80.0–100.0)
Platelets: 281 10*3/uL (ref 150–400)
RBC: 5.83 MIL/uL — ABNORMAL HIGH (ref 4.22–5.81)
RDW: 14.4 % (ref 11.5–15.5)
WBC: 15.5 10*3/uL — ABNORMAL HIGH (ref 4.0–10.5)
nRBC: 0 % (ref 0.0–0.2)

## 2022-07-17 MED ORDER — POTASSIUM CHLORIDE CRYS ER 20 MEQ PO TBCR
20.0000 meq | EXTENDED_RELEASE_TABLET | Freq: Once | ORAL | Status: AC
  Administered 2022-07-17: 20 meq via ORAL
  Filled 2022-07-17: qty 1

## 2022-07-17 MED ORDER — LACTATED RINGERS IV BOLUS
1000.0000 mL | Freq: Once | INTRAVENOUS | Status: AC
  Administered 2022-07-17: 1000 mL via INTRAVENOUS

## 2022-07-17 NOTE — ED Triage Notes (Signed)
Pt states that he was at home today and felt diaphoretic, weak, and some chest discomfort.

## 2022-07-17 NOTE — Discharge Instructions (Addendum)
If you develop chest pain, shortness of breath, fever, vomiting, abdominal or back pain, feeling like you are going to pass out, or any other new/concerning symptoms then return to the ER for evaluation.

## 2022-07-17 NOTE — ED Provider Notes (Signed)
[facility] Provider Note   CSN: [id number] Arrival date & time: 07/17/22  1623     History  Chief Complaint  Patient presents with   Chest Pain   Weakness    Andrew Nolan is a 61 y.o. male.  HPI 61 year old male with a history of hypertension on 3 different meds presents with near syncope and diaphoresis.  Around 130 this afternoon he woke up from a nap where he had been sitting in a chair and shortly thereafter became diaphoretic.  He felt overall weak and like he needed to lie down or might pass out.  He did eventually lie down and after about 10 minutes or so he felt better.  Never had any palpitations.  He states his chest felt a little abnormal though he thinks that was from anxiety.  Never had any dyspnea.  He has not been sick recently.  He did do multiple things today including a light workout and thinks he might have gotten dehydrated.  Wife checked his blood pressure and was in the 90s on multiple checks.  EMS was called and his blood pressure started slowly coming up and his glucose was around 99.  No headache, vomiting, vision changes or focal weakness.  He has drank some water since though is still urinating some dark urine.  However overall he is feeling better and does not feel near syncopal. However he thinks he's dehydrated and his mouth feels dry.  Home Medications Prior to Admission medications   Medication Sig Start Date End Date Taking? Authorizing Provider  amLODipine (NORVASC) 5 MG tablet Take 5 mg by mouth daily. 08/11/21   [provider]  Fluticasone Propionate (XHANCE) 93 MCG/ACT EXHU Place 2 sprays into the nose in the morning and at bedtime. 08/10/20   Valentina Shaggy, MD  halobetasol (ULTRAVATE) 0.05 % ointment  04/01/19   [provider]  hydrochlorothiazide (HYDRODIURIL) 12.5 MG tablet Take 12.5 mg by mouth every morning. 07/12/21   [provider]  ipratropium (ATROVENT) 0.03 % nasal spray Place  2 sprays into both nostrils 2 (two) times daily as needed for rhinitis. 03/28/21   Althea Charon, FNP  loratadine (CLARITIN) 10 MG tablet Take 10 mg by mouth daily.    [provider]  losartan (COZAAR) 100 MG tablet Take 100 mg by mouth daily.    [provider]  neomycin-polymyxin-hydrocortisone (CORTISPORIN) OTIC solution Apply 1-2 drops to toe after soaking BID 07/16/20   Regal, Tamala Fothergill, DPM  omeprazole (PRILOSEC) 20 MG capsule Take 20 mg by mouth 2 (two) times daily before a meal.    [provider]  sildenafil (VIAGRA) 50 MG tablet  06/12/19   [provider]      Allergies    Codeine and Yellow jacket venom [bee venom]    Review of Systems   Review of Systems  Constitutional:  Positive for diaphoresis.  Eyes:  Negative for visual disturbance.  Respiratory:  Negative for shortness of breath.   Cardiovascular:  Negative for chest pain.  Gastrointestinal:  Negative for vomiting.  Neurological:  Positive for light-headedness. Negative for syncope and headaches.    Physical Exam Updated Vital Signs BP 131/80   Pulse (!) 54   Temp 97.8 F (36.6 C)   Resp 17   Ht '5\' 11"'$  (1.803 m)   Wt 101.6 kg   SpO2 96%   BMI 31.24 kg/m  Physical Exam Vitals and nursing note reviewed.  Constitutional:  General: He is not in acute distress.    Appearance: He is well-developed. He is not ill-appearing or diaphoretic.  HENT:     Head: Normocephalic and atraumatic.  Eyes:     Extraocular Movements: Extraocular movements intact.     Pupils: Pupils are equal, round, and reactive to light.  Cardiovascular:     Rate and Rhythm: Normal rate and regular rhythm.     Heart sounds: Normal heart sounds. No murmur heard. Pulmonary:     Effort: Pulmonary effort is normal.     Breath sounds: Normal breath sounds.  Abdominal:     Palpations: Abdomen is soft.     Tenderness: There is no abdominal tenderness.  Skin:    General: Skin is warm and dry.   Neurological:     Mental Status: He is alert.     Comments: CN 3-12 grossly intact. 5/5 strength in all 4 extremities. Grossly normal sensation. Normal finger to nose.      ED Results / Procedures / Treatments   Labs (all labs ordered are listed, but only abnormal results are displayed) Labs Reviewed  BASIC METABOLIC PANEL - Abnormal; Notable for the following components:      Result Value   Potassium 3.4 (*)    Glucose, Bld 120 (*)    All other components within normal limits  CBC - Abnormal; Notable for the following components:   WBC 15.5 (*)    RBC 5.83 (*)    MCV 79.4 (*)    All other components within normal limits  URINALYSIS, ROUTINE W REFLEX MICROSCOPIC - Abnormal; Notable for the following components:   Protein, ur TRACE (*)    All other components within normal limits  TROPONIN I (HIGH SENSITIVITY)  TROPONIN I (HIGH SENSITIVITY)    EKG EKG Interpretation  Date/Time:  Monday July 17 2022 16:46:33 EST Ventricular Rate:  71 PR Interval:  154 QRS Duration: 106 QT Interval:  390 QTC Calculation: 423 R Axis:   41 Text Interpretation: Normal sinus rhythm Cannot rule out Anterior infarct , age undetermined T wave abnormality, consider inferior ischemia  no significant change since 2019 Confirmed by Sherwood Gambler [phone]) on 07/17/2022 7:26:54 PM  Radiology DG Chest 2 View  Result Date: 07/17/2022 CLINICAL DATA:  Chest pain. EXAM: CHEST - 2 VIEW COMPARISON:  No recent examination available for comparison. FINDINGS: The heart size and mediastinal contours are within normal limits. Both lungs are clear. Partially imaged anterior cervical discectomy and fusion hardware. No acute osseous abnormality. IMPRESSION: No active cardiopulmonary disease. Electronically Signed   By: Keane Police D.O.   On: 07/17/2022 17:11    Procedures Procedures    Medications Ordered in ED Medications  lactated ringers bolus 1,000 mL (1,000 mLs Intravenous New Bag/Given 07/17/22  2000)  potassium chloride SA (KLOR-CON M) CR tablet 20 mEq (20 mEq Oral Given 07/17/22 2040)    ED Course/ Medical Decision Making/ A&P                           Medical Decision Making Amount and/or Complexity of Data Reviewed Labs: ordered.    Details: Normal troponins x2.  Mild hypokalemia.  Normal hemoglobin.  Leukocytosis is probably a stress reaction. Radiology: ordered and independent interpretation performed.    Details: No CHF ECG/medicine tests: independent interpretation performed.    Details: Nonspecific but unchanged T waves.  Sinus rhythm.  Risk Prescription drug management.   Patient presents with near syncope.  Likely from more work today and less fluids.  Probably a little mild dehydration.  He is felt better since drinking p.o. fluids and an IV fluids given here.  I think his leukocytosis is probably a stress reaction given he has no infectious symptoms.  Otherwise he did have some vague chest symptoms but not typical of ACS and with troponins negative x2 and an unremarkable EKG from his baseline I think ACS is unlikely.  Doubt PE or dissection.  At this point he feels well and has close outpatient follow-up already, I think is stable for discharge.        Final Clinical Impression(s) / ED Diagnoses Final diagnoses:  Near syncope    Rx / DC Orders ED Discharge Orders     None         Sherwood Gambler, MD 07/17/22 2111

## 2022-09-28 ENCOUNTER — Encounter: Payer: Self-pay | Admitting: Gastroenterology

## 2022-10-09 ENCOUNTER — Ambulatory Visit (AMBULATORY_SURGERY_CENTER): Payer: 59 | Admitting: *Deleted

## 2022-10-09 VITALS — Ht 71.0 in | Wt 225.0 lb

## 2022-10-09 DIAGNOSIS — Z86010 Personal history of colonic polyps: Secondary | ICD-10-CM

## 2022-10-09 MED ORDER — PEG 3350-KCL-NA BICARB-NACL 420 G PO SOLR: 4000.0000 mL | Freq: Once | ORAL | 0 refills | Status: AC

## 2022-10-09 NOTE — Progress Notes (Signed)
No egg or soy allergy known to patient  No issues known to pt with past sedation with any surgeries or procedures Patient denies ever being told they had issues or difficulty with intubation  No FH of Malignant Hyperthermia Pt is not on diet pills Pt is not on  home 02  Pt is not on blood thinners  Pt denies issues with constipation  Pt is not on dialysis Pt denies any upcoming cardiac testing Pt encouraged to use to use Singlecare or Goodrx to reduce cost  Patient's chart reviewed by John Nulty CNRA prior to previsit and patient appropriate for the [facility].  Previsit completed and red dot placed by patient's name on their procedure day (on provider's schedule).  . Visit by phone Instructions reviewed with pt and pt states understanding. Instructed to review again prior to procedure. Pt states they will.  Instructions sent by mail 

## 2022-10-25 ENCOUNTER — Encounter: Payer: Self-pay | Admitting: Gastroenterology

## 2022-11-07 ENCOUNTER — Ambulatory Visit (AMBULATORY_SURGERY_CENTER): Payer: 59 | Admitting: Gastroenterology

## 2022-11-07 ENCOUNTER — Encounter: Payer: Self-pay | Admitting: Gastroenterology

## 2022-11-07 VITALS — BP 119/79 | HR 62 | Temp 97.7°F | Resp 14 | Ht 71.0 in | Wt 225.0 lb

## 2022-11-07 DIAGNOSIS — Z09 Encounter for follow-up examination after completed treatment for conditions other than malignant neoplasm: Secondary | ICD-10-CM | POA: Diagnosis present

## 2022-11-07 DIAGNOSIS — Z86010 Personal history of colonic polyps: Secondary | ICD-10-CM

## 2022-11-07 DIAGNOSIS — D123 Benign neoplasm of transverse colon: Secondary | ICD-10-CM

## 2022-11-07 MED ORDER — SODIUM CHLORIDE 0.9 % IV SOLN: 500.0000 mL | INTRAVENOUS | Status: DC

## 2022-11-07 NOTE — Patient Instructions (Signed)
Repeat colonoscopy in 5 years for surveillance.  Handouts Provided:  Polyps  YOU HAD AN ENDOSCOPIC PROCEDURE TODAY AT [facility]:   Refer to the procedure report that was given to you for any specific questions about what was found during the examination.  If the procedure report does not answer your questions, please call your gastroenterologist to clarify.  If you requested that your care partner not be given the details of your procedure findings, then the procedure report has been included in a sealed envelope for you to review at your convenience later.  YOU SHOULD EXPECT: Some feelings of bloating in the abdomen. Passage of more gas than usual.  Walking can help get rid of the air that was put into your GI tract during the procedure and reduce the bloating. If you had a lower endoscopy (such as a colonoscopy or flexible sigmoidoscopy) you may notice spotting of blood in your stool or on the toilet paper. If you underwent a bowel prep for your procedure, you may not have a normal bowel movement for a few days.  Please Note:  You might notice some irritation and congestion in your nose or some drainage.  This is from the oxygen used during your procedure.  There is no need for concern and it should clear up in a day or so.  SYMPTOMS TO REPORT IMMEDIATELY:  Following lower endoscopy (colonoscopy or flexible sigmoidoscopy):  Excessive amounts of blood in the stool  Significant tenderness or worsening of abdominal pains  Swelling of the abdomen that is new, acute  Fever of 100F or higher  For urgent or emergent issues, a gastroenterologist can be reached at any hour by calling [phone]. Do not use MyChart messaging for urgent concerns.    DIET:  We do recommend a small meal at first, but then you may proceed to your regular diet.  Drink plenty of fluids but you should avoid alcoholic beverages for 24 hours.  ACTIVITY:  You should plan to take it easy for the rest of  today and you should NOT DRIVE or use heavy machinery until tomorrow (because of the sedation medicines used during the test).    FOLLOW UP: Our staff will call the number listed on your records the next business day following your procedure.  We will call around 7:15- 8:00 am to check on you and address any questions or concerns that you may have regarding the information given to you following your procedure. If we do not reach you, we will leave a message.     If any biopsies were taken you will be contacted by phone or by letter within the next 1-3 weeks.  Please call us at [phone] if you have not heard about the biopsies in 3 weeks.    SIGNATURES/CONFIDENTIALITY: You and/or your care partner have signed paperwork which will be entered into your electronic medical record.  These signatures attest to the fact that that the information above on your After Visit Summary has been reviewed and is understood.  Full responsibility of the confidentiality of this discharge information lies with you and/or your care-partner.

## 2022-11-07 NOTE — Op Note (Signed)
[facility] Patient Name: Andrew Nolan Procedure Date: 11/07/2022 12:18 PM MRN: [id number] Endoscopist: Andrew Nolan , MD, [id number] Age: 62 Referring MD:  Date of Birth: 07-16-1961 Gender: Male Account #: [id number] Procedure:                Colonoscopy Indications:              Surveillance: Personal history of adenomatous                            polyps on last colonoscopy > 3 years ago                           SSP and tubular adenoma November 2020. Fair prep in                            some areas on that exam. Medicines:                Monitored Anesthesia Care Procedure:                Pre-Anesthesia Assessment:                           - Prior to the procedure, a History and Physical                            was performed, and patient medications and                            allergies were reviewed. The patient's tolerance of                            previous anesthesia was also reviewed. The risks                            and benefits of the procedure and the sedation                            options and risks were discussed with the patient.                            All questions were answered, and informed consent                            was obtained. Prior Anticoagulants: The patient has                            taken no anticoagulant or antiplatelet agents. ASA                            Grade Assessment: II - A patient with mild systemic                            disease. After reviewing the risks and benefits,  the patient was deemed in satisfactory condition to                            undergo the procedure.                           After obtaining informed consent, the colonoscope                            was passed under direct vision. Throughout the                            procedure, the patient's blood pressure, pulse, and                            oxygen saturations were monitored continuously.  The                            CF [id number] was introduced through the anus                            and advanced to the the cecum, identified by                            appendiceal orifice and ileocecal valve. The                            colonoscopy was somewhat difficult due to a                            redundant colon. Successful completion of the                            procedure was aided by using manual pressure and                            straightening and shortening the scope to obtain                            bowel loop reduction. The patient tolerated the                            procedure well. The quality of the bowel                            preparation was excellent. The ileocecal valve,                            appendiceal orifice, and rectum were photographed.                            The bowel preparation used was GoLYTELY via split  dose instruction. Scope In: 12:22:30 PM Scope Out: [id number] PM Scope Withdrawal Time: 0 hours 10 minutes 58 seconds  Total Procedure Duration: 0 hours 13 minutes 49 seconds  Findings:                 The perianal and digital rectal examinations were                            normal.                           A post polypectomy scar was found in the cecum.                            There was no evidence of the previous polyp.                           Repeat examination of right colon under NBI                            performed.                           A diminutive polyp was found in the transverse                            colon. The polyp was sessile. The polyp was removed                            with a cold snare. Resection and retrieval were                            complete.                           The exam was otherwise without abnormality on                            direct and retroflexion views. Complications:            No immediate complications. Estimated  Blood Loss:     Estimated blood loss was minimal. Impression:               - Post-polypectomy scar in the cecum.                           - One diminutive polyp in the transverse colon,                            removed with a cold snare. Resected and retrieved.                           - The examination was otherwise normal on direct                            and retroflexion views. Recommendation:           -  Patient has a contact number available for                            emergencies. The signs and symptoms of potential                            delayed complications were discussed with the                            patient. Return to normal activities tomorrow.                            Written discharge instructions were provided to the                            patient.                           - Resume previous diet.                           - Continue present medications.                           - Await pathology results.                           - Repeat colonoscopy in 5 years for surveillance. Andrew Nolan L. [name], MD 11/07/2022 12:40:34 PM This report has been signed electronically.

## 2022-11-07 NOTE — Progress Notes (Signed)
History and Physical:  This patient presents for endoscopic testing for: Encounter Diagnosis  Name Primary?   Hx of colonic polyp Yes    SSP and TA in Nov 2020 - fair prep in some areas on that exam. Patient denies chronic abdominal pain, rectal bleeding, constipation or diarrhea.   Patient is otherwise without complaints or active issues today.   Past Medical History: Past Medical History:  Diagnosis Date   Allergy    Arthritis    ECRB (extensor carpi radialis brevis) tenosynovitis    GERD (gastroesophageal reflux disease)    occasional    Hypertension    Lateral epicondylitis of right elbow    Medial epicondylitis of right elbow    PONV (postoperative nausea and vomiting)    only with one surgery in 2005, no other problems with other surgeries   Shingles    Wears glasses      Past Surgical History: Past Surgical History:  Procedure Laterality Date   COLONOSCOPY  06/17/2009   dr Deatra Ina    FRACTURE SURGERY     lt thumb-age 25   LATERAL EPICONDYLE RELEASE Right 11/11/2013   Procedure: RIGHT TENOTOMY ELBOW LATERAL EPICONDYLITIS TENNIS ELBOW/RIGHT ELBOW INJECTION ASPIRATION ARTHROCENTESIS INTERMEDULLARY JOINT BURSA;  Surgeon: Lorn Junes, MD;  Location: [facility];  Service: Orthopedics;  Laterality: Right;   ORIF METACARPAL FRACTURE  1982   right hand   ORIF [place]   compound-right   ORIF WRIST FRACTURE  1975   left   SHOULDER ARTHROSCOPY W/ LABRAL REPAIR  1999   right   SHOULDER ARTHROSCOPY W/ ROTATOR CUFF REPAIR  2005   left   UPPER GI ENDOSCOPY     VASECTOMY     WISDOM TOOTH EXTRACTION      Allergies: Allergies  Allergen Reactions   Codeine     REACTION: itching   Yellow Jacket Venom [Bee Venom] Hives and Itching    Outpatient Meds: Current Outpatient Medications  Medication Sig Dispense Refill   amLODipine (NORVASC) 5 MG tablet Take 5 mg by mouth daily.     Fluticasone Propionate (XHANCE) 93 MCG/ACT EXHU  Place 2 sprays into the nose in the morning and at bedtime. 32 mL 0   hydrochlorothiazide (HYDRODIURIL) 12.5 MG tablet Take 12.5 mg by mouth every morning.     olmesartan (BENICAR) 40 MG tablet Take 40 mg by mouth daily.     omeprazole (PRILOSEC) 40 MG capsule TAKE ONE CAPSULE DAILY for 90 days     ferrous gluconate (FERGON) 324 MG tablet Take one tablet with largest meal once a day or every other day as tolerated. If not tolerating call MD. (Patient not taking: Reported on 10/09/2022)     halobetasol (ULTRAVATE) 0.05 % ointment      ipratropium (ATROVENT) 0.03 % nasal spray Place 2 sprays into both nostrils 2 (two) times daily as needed for rhinitis. (Patient not taking: Reported on 10/09/2022) 30 mL 5   loratadine (CLARITIN) 10 MG tablet Take 10 mg by mouth daily. (Patient not taking: Reported on 10/09/2022)     losartan (COZAAR) 100 MG tablet Take 100 mg by mouth daily. (Patient not taking: Reported on 10/09/2022)     neomycin-polymyxin-hydrocortisone (CORTISPORIN) OTIC solution Apply 1-2 drops to toe after soaking BID (Patient not taking: Reported on 10/09/2022) 10 mL 1   sildenafil (VIAGRA) 50 MG tablet      triamcinolone cream (KENALOG) 0.1 % Apply topically.     Current Facility-Administered Medications  Medication Dose Route Frequency Provider Last Rate Last Admin   0.9 %  sodium chloride infusion  500 mL Intravenous Continuous Danis, Estill Cotta III, MD          ___________________________________________________________________ Objective   Exam:  BP 119/76   Pulse 60   Temp 97.7 F (36.5 C) (Temporal)   Ht '5\' 11"'$  (1.803 m)   Wt 225 lb (102.1 kg)   SpO2 98%   BMI 31.38 kg/m   CV: regular , S1/S2 Resp: clear to auscultation bilaterally, normal RR and effort noted GI: soft, no tenderness, with active bowel sounds.   Assessment: Encounter Diagnosis  Name Primary?   Hx of colonic polyp Yes     Plan: Colonoscopy  The benefits and risks of the planned procedure were described in  detail with the patient or (when appropriate) their health care proxy.  Risks were outlined as including, but not limited to, bleeding, infection, perforation, adverse medication reaction leading to cardiac or pulmonary decompensation, pancreatitis (if ERCP).  The limitation of incomplete mucosal visualization was also discussed.  No guarantees or warranties were given.    The patient is appropriate for an endoscopic procedure in the ambulatory setting.   - Wilfrid Lund, MD

## 2022-11-07 NOTE — Progress Notes (Signed)
Pt's states no medical or surgical changes since previsit or office visit. 

## 2022-11-07 NOTE — Progress Notes (Signed)
Sedate, gd SR's, VSS, report to RN 

## 2022-11-07 NOTE — Progress Notes (Signed)
Called to room to assist during endoscopic procedure.  Patient ID and intended procedure confirmed with present staff. Received instructions for my participation in the procedure from the performing physician.  

## 2022-11-08 ENCOUNTER — Telehealth: Payer: Self-pay

## 2022-11-08 NOTE — Telephone Encounter (Signed)
Post procedure follow up call, no answer. 

## 2022-11-09 ENCOUNTER — Encounter: Payer: Self-pay | Admitting: Gastroenterology

## 2023-01-17 ENCOUNTER — Encounter: Payer: Self-pay | Admitting: Podiatry

## 2023-01-17 ENCOUNTER — Ambulatory Visit: Payer: 59 | Admitting: Podiatry

## 2023-01-17 DIAGNOSIS — M79675 Pain in left toe(s): Secondary | ICD-10-CM

## 2023-01-17 DIAGNOSIS — M79674 Pain in right toe(s): Secondary | ICD-10-CM | POA: Diagnosis not present

## 2023-01-17 DIAGNOSIS — B351 Tinea unguium: Secondary | ICD-10-CM

## 2023-01-17 NOTE — Progress Notes (Signed)
This patient presents to the office with chief complaint of long thick painful nails.  Patient says the nails are painful walking and wearing shoes.  This patient is unable to self treat.  This patient is unable to trim his nails since he is unable to reach his nails.  She presents to the office for preventative foot care services.  General Appearance  Alert, conversant and in no acute stress.  Vascular  Dorsalis pedis and posterior tibial  pulses are palpable  bilaterally.  Capillary return is within normal limits  bilaterally. Temperature is within normal limits  bilaterally.  Neurologic  Senn-Weinstein monofilament wire test within normal limits  bilaterally. Muscle power within normal limits bilaterally.  Nails Thick disfigured discolored nails with subungual debris  hallux nails  bilaterally. No evidence of bacterial infection or drainage bilaterally.  Orthopedic  No limitations of motion  feet .  No crepitus or effusions noted.  No bony pathology or digital deformities noted.  Skin  normotropic skin with no porokeratosis noted bilaterally.  No signs of infections or ulcers noted.     Onychomycosis  Nails  B/L.  Pain in right toes  Pain in left toes  Debridement of nails both feet followed trimming the nails with dremel tool.    RTC 4 months.   Helane Gunther DPM

## 2023-02-27 ENCOUNTER — Ambulatory Visit (HOSPITAL_BASED_OUTPATIENT_CLINIC_OR_DEPARTMENT_OTHER): Payer: 59 | Attending: Internal Medicine | Admitting: Physical Therapy

## 2023-02-27 ENCOUNTER — Encounter (HOSPITAL_BASED_OUTPATIENT_CLINIC_OR_DEPARTMENT_OTHER): Payer: Self-pay | Admitting: Physical Therapy

## 2023-02-27 DIAGNOSIS — M5459 Other low back pain: Secondary | ICD-10-CM | POA: Diagnosis present

## 2023-02-27 DIAGNOSIS — M6281 Muscle weakness (generalized): Secondary | ICD-10-CM | POA: Insufficient documentation

## 2023-02-27 NOTE — Therapy (Signed)
OUTPATIENT PHYSICAL THERAPY THORACOLUMBAR EVALUATION   Patient Name: Andrew Nolan MRN: [id number] DOB:[birth year], 62 y.o., male Today's Date: 02/28/2023  END OF SESSION:  PT End of Session - 02/27/23 0910     Visit Number 1    Number of Visits 10    Date for PT Re-Evaluation 04/10/23    Authorization Type [employer]    PT Start Time 0858    PT Stop Time 0947    PT Time Calculation (min) 49 min    Activity Tolerance Patient tolerated treatment well    Behavior During Therapy [facility] for tasks assessed/performed             Past Medical History:  Diagnosis Date   Allergy    Arthritis    ECRB (extensor carpi radialis brevis) tenosynovitis    GERD (gastroesophageal reflux disease)    occasional    Hypertension    Lateral epicondylitis of right elbow    Medial epicondylitis of right elbow    PONV (postoperative nausea and vomiting)    only with one surgery in 2005, no other problems with other surgeries   Shingles    Wears glasses    Past Surgical History:  Procedure Laterality Date   COLONOSCOPY  06/17/2009   dr Arlyce Dice    FRACTURE SURGERY     lt thumb-age 67   LATERAL EPICONDYLE RELEASE Right 11/11/2013   Procedure: RIGHT TENOTOMY ELBOW LATERAL EPICONDYLITIS TENNIS ELBOW/RIGHT ELBOW INJECTION ASPIRATION ARTHROCENTESIS INTERMEDULLARY JOINT BURSA;  Surgeon: Nilda Simmer, MD;  Location: [facility];  Service: Orthopedics;  Laterality: Right;   ORIF METACARPAL FRACTURE  1982   right hand   ORIF RADIUS & ULNA FRACTURES  1976   compound-right   ORIF WRIST FRACTURE  1975   left   SHOULDER ARTHROSCOPY W/ LABRAL REPAIR  1999   right   SHOULDER ARTHROSCOPY W/ ROTATOR CUFF REPAIR  2005   left   UPPER GI ENDOSCOPY     VASECTOMY     WISDOM TOOTH EXTRACTION     Patient Active Problem List   Diagnosis Date Noted   Pain due to onychomycosis of toenails of both feet 01/17/2023   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   Class 1 obesity due to excess calories with body  mass index (BMI) of 30.0 to 30.9 in adult 07/15/2021   Hypertension    Tinnitus of both ears 03/25/2021   Seasonal and perennial allergic rhinitis 12/09/2019   ECRB (extensor carpi radialis brevis) tenosynovitis    Medial epicondylitis of right elbow    Lateral epicondylitis of right elbow     PCP: Charlane Ferretti, DO  REFERRING PROVIDER: Charlane Ferretti, DO  REFERRING DIAG: M54.9 (ICD-10-CM) - Back Pain  Rationale for Evaluation and Treatment: Rehabilitation  THERAPY DIAG:  Other low back pain  Muscle weakness (generalized)  ONSET DATE: February 2024  SUBJECTIVE:  SUBJECTIVE STATEMENT: Pt has had back pain for 15-20 years with no specific MOI.  Pt had PT for lumbar about 10 years ago.  Pt didn't receive pain relief from PT, but doesn't think he followed up with the exercises as he should have.  Pt began having L foot pain due to inflamed tendons and was limited with ambulation.  He began working with a trainer which resolved his foot pain.  When he returned to walking he began having increased lumbar pain.  Pt works out with a Psychologist, educational at his house 3x/wk which helps his back.  He is feeling better overall and able to walk further.  He reports reduced tightness though has tight HS.    Pt [place] the beach during the winter and noticed having increased lumbar pain while trying to walk on the beach.  He noticed in February how limited he was walking on the beach due to pain.  Pt has increased lumbar pain with walking 2-3 miles.  Pt does a lot of walking when he travels.  Pt walks 2 miles every other day.  Pt is limited with yard work due to bending including trying to pull weeds.  Pt has pain with prolonged standing.  He has pain standing and greeting people at the temple.  Pt played pickleball 1 month ago.   Pt enjoys skiing and had lumbar pain when skiing in March.  Pt saw MD on 01/26/23 and he ordered PT.  PERTINENT HISTORY:  Chronic lumbar pain Arthritis, HTN, and cervical spondylosis PSHx:  Cervical Fusion 12/18/2019, L knee arthroscopic surgery for meniscus 01/19, R shoulder surgery in 1999 and L shoulder RCR in 2005, R elbow lateral epicondyle release in 2015, and R wrist surgery    PAIN:  Are you having pain? Yes NPRS:  1/10 current, 6-7/10 worst, 0/10 best Location:  R sided lumbar pain Pt has occasional numbness in L thigh.  He can have some pain traveling to groin and knee when it is really aggravated.    PRECAUTIONS: Other: cervical fusion  WEIGHT BEARING RESTRICTIONS: No  FALLS:  Has patient fallen in last 6 months? 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  Pt was able to play pickleball.  PATIENT GOALS: wants to be able to play pickleball.  To be able to ski without pain.  Decreased pain.    OBJECTIVE:   DIAGNOSTIC FINDINGS:  None  PATIENT SURVEYS:  FOTO 56 with a goal of 64 at visit 11   COGNITION: Overall cognitive status: Within functional limits for tasks assessed       MUSCLE LENGTH:      Pt is very tight in bilat HS, a little worse on R.    PALPATION: Pt has moderate soft tissue tightness in R sided lumbar > L sided lumbar paraspinals. Pt has tenderness with palpation of R sided lumbar paraspinals.     LUMBAR ROM:   AROM eval  Flexion WFL  Extension WFL with little pain  Right lateral flexion Just proximal to  lateral knee jt line  Left lateral flexion Just proximal to  lateral knee jt line with tightness and little pain  Right rotation WFL  Left rotation 75%   (Blank rows = not tested)  LOWER EXTREMITY ROM:     Active  Right eval  Left eval  Hip flexion     Hip extension [facility]  Hip abduction [facility]  Hip adduction    Hip internal rotation    Hip external rotation    Knee flexion    Knee extension    Ankle dorsiflexion    Ankle plantarflexion    Ankle inversion    Ankle eversion     (Blank rows = not tested)  LOWER EXTREMITY MMT:    MMT Right eval Left eval  Hip flexion 5/5 5/5  Hip extension 4+/5 5/5  Hip abduction 5/5 5/5  Hip adduction    Hip internal rotation    Hip external rotation    Knee flexion 5/5 5/5  Knee extension 5/5 5/5  Ankle dorsiflexion    Ankle plantarflexion    Ankle inversion    Ankle eversion     (Blank rows = not tested)  LUMBAR SPECIAL TESTS:  Negative Supine SLR test bilat though pt could feel stretch all the way to his R sided lower lumbar   GAIT: Assistive device utilized: None Level of assistance: Complete Independence Comments: WFL, heel to toe gait pattern  TODAY'S TREATMENT:                                                                                                                                 Pt performed supine lumbar rotation 2x10 reps and PPT 2x10 reps.  Pt received a HEP handout and was educated in correct form and appropriate frequency.  Pt was instructed he should not have increased pain with HEP.  PATIENT EDUCATION:  Education details: dx, objective findings, relevant anatomy, HEP, and POC.  PT answered pt's questions.   Person educated: Patient Education method: Explanation, Demonstration, Tactile cues, Verbal cues, and Handouts Education comprehension: verbalized understanding, returned demonstration, verbal cues required, and tactile cues required  HOME EXERCISE PROGRAM: Access Code: [email or web address] URL: [email or web address] Date: 02/27/2023 Prepared by: Aaron Edelman  Exercises - Hooklying Lumbar Rotation  - 2 x daily - 7 x weekly - 2 sets - 10 reps - Supine Posterior Pelvic Tilt  - 2 x daily - 7 x weekly - 2 sets - 10 reps  ASSESSMENT:  CLINICAL  IMPRESSION: Patient is a 62 y.o. male with a dx of back pain presenting to the clinic with c/o's of R sided lumbar pain.  He has a hx of lumbar pain which exacerbated this past winter while taking walks on the beach.  He has been working out with a trainer which has helped his back.  He is feeling better overall and able to walk further.    Pt enjoys walking and is limited with extended distance with his walking program.  Pt is limited with yard work due to bending including trying to pull weeds.  Pt has pain with prolonged standing including with his volunteer activities.  Pt is limited with his recreational activities including pickleball and is not skiing.  Pt has minimal limitations in L lumbar rotation AROM and minimal weakness in R hip extension.  Pt has tightness in bilat HS.  He should benefit from skilled PT services to address impairments and to improve overall function.    OBJECTIVE IMPAIRMENTS: decreased activity tolerance, decreased mobility, difficulty walking, decreased ROM, decreased strength, hypomobility, impaired flexibility, and pain.   ACTIVITY LIMITATIONS: bending, standing, and locomotion level  PARTICIPATION LIMITATIONS: yard work and recreational activities  PERSONAL FACTORS: Time since onset of injury/illness/exacerbation and 3+ comorbidities: arthritis, cervical fusion, and L knee arthroscopic surgery   are also affecting patient's functional outcome.   REHAB POTENTIAL: Good  CLINICAL DECISION MAKING: Stable/uncomplicated  EVALUATION COMPLEXITY: Low   GOALS:  SHORT TERM GOALS: Target date: 03/20/2023   Pt will be independent and compliant with HEP for improved pain, ROM, strength, and function.  Baseline: Goal status: INITIAL  2.  Pt will demo L lumbar rotation AROM to be [facility] for improved tightness and mobility.  Baseline:  Goal status: INITIAL  3.  Pt will report at least a 25% improvement in pain with standing activities.  Baseline:  Goal status:  INITIAL    LONG TERM GOALS: Target date: 04/10/2023   Pt will demonstrate improved flexibility in HS in order to reduce strain on lumbar and improve mobility.  Baseline:  Goal status: INITIAL  2.  Pt will demo improved core strength as evidenced by performance and progression of core exercises without adverse effects and improved R hip ext strength to 5/5 MMT for improved performance of and tolerance with functional mobility and recreational activities.  Baseline:  Goal status: INITIAL  3.  Pt will report he is able to perform his normal standing activities including his volunteer activities without significant lumbar pain.  Baseline:  Goal status: INITIAL  4.  Pt will demo correct form and body mechanics with hip hinge and squatting to lift in order to perform his yard work and functional lifting with reduced stress and strain on his back. Baseline:  Goal status: INITIAL  5.  Pt will report improved tolerance/reduced pain with yard work.   Baseline:  Goal status: INITIAL    PLAN:  PT FREQUENCY: 1-2x/week  PT DURATION: 6 weeks  PLANNED INTERVENTIONS: Therapeutic exercises, Therapeutic activity, Neuromuscular re-education, Balance training, Gait training, Patient/Family education, Self Care, Joint mobilization, Stair training, Aquatic Therapy, Dry Needling, Electrical stimulation, Spinal mobilization, Cryotherapy, Moist heat, Taping, Traction, Ultrasound, Manual therapy, and Re-evaluation.  PLAN FOR NEXT SESSION: Review his current workout/stretching/home program.  Review and perform HEP.  Core strengthening, HS flexibility, and STW to lumbar.   Audie Clear III PT, DPT 02/28/23 11:52 AM

## 2023-03-03 ENCOUNTER — Encounter (HOSPITAL_BASED_OUTPATIENT_CLINIC_OR_DEPARTMENT_OTHER): Payer: Self-pay

## 2023-03-03 ENCOUNTER — Ambulatory Visit (HOSPITAL_BASED_OUTPATIENT_CLINIC_OR_DEPARTMENT_OTHER): Payer: 59

## 2023-03-03 DIAGNOSIS — M5459 Other low back pain: Secondary | ICD-10-CM

## 2023-03-03 DIAGNOSIS — M6281 Muscle weakness (generalized): Secondary | ICD-10-CM

## 2023-03-03 NOTE — Therapy (Signed)
OUTPATIENT PHYSICAL THERAPY THORACOLUMBAR TREATMENT   Patient Name: Andrew Nolan MRN: [id number] DOB:01-13-1961, 62 y.o., male Today's Date: 03/03/2023  END OF SESSION:  PT End of Session - 03/03/23 1045     Visit Number 2    Number of Visits 10    Date for PT Re-Evaluation 04/10/23    Authorization Type [employer]    PT Start Time 1045    PT Stop Time 1131    PT Time Calculation (min) 46 min    Activity Tolerance Patient tolerated treatment well    Behavior During Therapy WFL for tasks assessed/performed              Past Medical History:  Diagnosis Date   Allergy    Arthritis    ECRB (extensor carpi radialis brevis) tenosynovitis    GERD (gastroesophageal reflux disease)    occasional    Hypertension    Lateral epicondylitis of right elbow    Medial epicondylitis of right elbow    PONV (postoperative nausea and vomiting)    only with one surgery in 2005, no other problems with other surgeries   Shingles    Wears glasses    Past Surgical History:  Procedure Laterality Date   COLONOSCOPY  06/17/2009   dr Arlyce Dice    FRACTURE SURGERY     lt thumb-age 22   LATERAL EPICONDYLE RELEASE Right 11/11/2013   Procedure: RIGHT TENOTOMY ELBOW LATERAL EPICONDYLITIS TENNIS ELBOW/RIGHT ELBOW INJECTION ASPIRATION ARTHROCENTESIS INTERMEDULLARY JOINT BURSA;  Surgeon: Nilda Simmer, MD;  Location: [facility];  Service: Orthopedics;  Laterality: Right;   ORIF METACARPAL FRACTURE  1982   right hand   ORIF RADIUS & ULNA FRACTURES  1976   compound-right   ORIF WRIST FRACTURE  1975   left   SHOULDER ARTHROSCOPY W/ LABRAL REPAIR  1999   right   SHOULDER ARTHROSCOPY W/ ROTATOR CUFF REPAIR  2005   left   UPPER GI ENDOSCOPY     VASECTOMY     WISDOM TOOTH EXTRACTION     Patient Active Problem List   Diagnosis Date Noted   Pain due to onychomycosis of toenails of both feet 01/17/2023   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   Class 1 obesity due to excess calories with body  mass index (BMI) of 30.0 to 30.9 in adult 07/15/2021   Hypertension    Tinnitus of both ears 03/25/2021   Seasonal and perennial allergic rhinitis 12/09/2019   ECRB (extensor carpi radialis brevis) tenosynovitis    Medial epicondylitis of right elbow    Lateral epicondylitis of right elbow     PCP: Charlane Ferretti, DO  REFERRING PROVIDER: Charlane Ferretti, DO  REFERRING DIAG: M54.9 (ICD-10-CM) - Back Pain  Rationale for Evaluation and Treatment: Rehabilitation  THERAPY DIAG:  Other low back pain  Muscle weakness (generalized)  ONSET DATE: February 2024  SUBJECTIVE:  SUBJECTIVE STATEMENT:  Pt reports his R side of low back bothers him the most. Doing well  Eval: Pt has had back pain for 15-20 years with no specific MOI.  Pt had PT for lumbar about 10 years ago.  Pt didn't receive pain relief from PT, but doesn't think he followed up with the exercises as he should have.  Pt began having L foot pain due to inflamed tendons and was limited with ambulation.  He began working with a trainer which resolved his foot pain.  When he returned to walking he began having increased lumbar pain.  Pt works out with a Psychologist, educational at his house 3x/wk which helps his back.  He is feeling better overall and able to walk further.  He reports reduced tightness though has tight HS.    [place] and noticed having increased lumbar pain while trying to walk on the beach.  He noticed in February how limited he was walking on the beach due to pain.  Pt has increased lumbar pain with walking 2-3 miles.  Pt does a lot of walking when he travels.  Pt walks 2 miles every other day.  Pt is limited with yard work due to bending including trying to pull weeds.  Pt has pain with prolonged standing.  He has pain  standing and greeting people at the temple.  Pt played pickleball 1 month ago.  Pt enjoys skiing and had lumbar pain when skiing in March.  Pt saw MD on 01/26/23 and he ordered PT.  PERTINENT HISTORY:  Chronic lumbar pain Arthritis, HTN, and cervical spondylosis PSHx:  Cervical Fusion 12/18/2019, L knee arthroscopic surgery for meniscus 01/19, R shoulder surgery in 1999 and L shoulder RCR in 2005, R elbow lateral epicondyle release in 2015, and R wrist surgery    PAIN:  Are you having pain? Yes NPRS:  1/10 current, 6-7/10 worst, 0/10 best Location:  R sided lumbar pain Pt has occasional numbness in L thigh.  He can have some pain traveling to groin and knee when it is really aggravated.    PRECAUTIONS: Other: cervical fusion  WEIGHT BEARING RESTRICTIONS: No  FALLS:  Has patient fallen in last 6 months? 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  Pt was able to play pickleball.  PATIENT GOALS: wants to be able to play pickleball.  To be able to ski without pain.  Decreased pain.    OBJECTIVE:   DIAGNOSTIC FINDINGS:  None  PATIENT SURVEYS:  FOTO 56 with a goal of 64 at visit 11   COGNITION: Overall cognitive status: Within functional limits for tasks assessed       MUSCLE LENGTH:      Pt is very tight in bilat HS, a little worse on R.    PALPATION: Pt has moderate soft tissue tightness in R sided lumbar > L sided lumbar paraspinals. Pt has tenderness with palpation of R sided lumbar paraspinals.     LUMBAR ROM:   AROM eval  Flexion WFL  Extension WFL with little pain  Right lateral flexion Just proximal to  lateral knee jt line  Left lateral flexion Just proximal to  lateral knee jt line with tightness and little pain  Right rotation WFL  Left rotation 75%   (  Blank rows = not  tested)  LOWER EXTREMITY ROM:     Active  Right eval Left eval  Hip flexion    Hip extension [facility]  Hip abduction [facility]  Hip adduction    Hip internal rotation    Hip external rotation    Knee flexion    Knee extension    Ankle dorsiflexion    Ankle plantarflexion    Ankle inversion    Ankle eversion     (Blank rows = not tested)  LOWER EXTREMITY MMT:    MMT Right eval Left eval  Hip flexion 5/5 5/5  Hip extension 4+/5 5/5  Hip abduction 5/5 5/5  Hip adduction    Hip internal rotation    Hip external rotation    Knee flexion 5/5 5/5  Knee extension 5/5 5/5  Ankle dorsiflexion    Ankle plantarflexion    Ankle inversion    Ankle eversion     (Blank rows = not tested)  LUMBAR SPECIAL TESTS:  Negative Supine SLR test bilat though pt could feel stretch all the way to his R sided lower lumbar   GAIT: Assistive device utilized: None Level of assistance: Complete Independence Comments: WFL, heel to toe gait pattern  TODAY'S TREATMENT:                                                                                                                                 -STM to R lumbar paraspinals -Cupping to lower lumbar paraspinals bilaterally but with focus on the right side as well as right QL  -HSS with strap 30sec x3 ea -Piriformis stretch 30seconds x3 -Child's pose with side bend to left 30 seconds x 3 -Seated QL stretch 30 seconds x 3R Update and review of HEP   PATIENT EDUCATION:  Education details: dx, objective findings, relevant anatomy, HEP, and POC.  PT answered pt's questions.   Person educated: Patient Education method: Explanation, Demonstration, Tactile cues, Verbal cues, and Handouts Education comprehension: verbalized understanding, returned demonstration, verbal cues required, and tactile cues required  HOME EXERCISE PROGRAM: Access Code: [email or web address] URL: [email or web address] Date: 02/27/2023 Prepared by: Aaron Edelman  Exercises - Hooklying Lumbar Rotation  - 2 x daily - 7 x weekly - 2 sets - 10 reps - Supine Posterior Pelvic Tilt  - 2 x daily - 7 x weekly - 2 sets - 10 reps  ASSESSMENT:  CLINICAL IMPRESSION: Patient has good tolerance for stretching program for bilateral lumbopelvic musculature and LEs.  Cues required with hamstring stretching to maintain knee extension.  Patient tender to palpation of right lower lumbar paraspinals and right QL.  Performed STM to decrease restrictions here.  Also trialed cupping to this area with good response.  Patient provided with handout of additional stretches to perform at home.  Patient will benefit from continued PT for strengthening, mobility, and reduction of pain. OBJECTIVE IMPAIRMENTS: decreased activity tolerance, decreased mobility, difficulty walking, decreased  ROM, decreased strength, hypomobility, impaired flexibility, and pain.   ACTIVITY LIMITATIONS: bending, standing, and locomotion level  PARTICIPATION LIMITATIONS: yard work and recreational activities  PERSONAL FACTORS: Time since onset of injury/illness/exacerbation and 3+ comorbidities: arthritis, cervical fusion, and L knee arthroscopic surgery   are also affecting patient's functional outcome.   REHAB POTENTIAL: Good  CLINICAL DECISION MAKING: Stable/uncomplicated  EVALUATION COMPLEXITY: Low   GOALS:  SHORT TERM GOALS: Target date: 03/20/2023   Pt will be independent and compliant with HEP for improved pain, ROM, strength, and function.  Baseline: Goal status: INITIAL  2.  Pt will demo L lumbar rotation AROM to be [facility] for improved tightness and mobility.  Baseline:  Goal status: INITIAL  3.  Pt will report at least a 25% improvement in pain with standing activities.  Baseline:  Goal status: INITIAL    LONG TERM GOALS: Target date: 04/10/2023   Pt will demonstrate improved flexibility in HS in order to reduce strain on lumbar and improve mobility.  Baseline:  Goal  status: INITIAL  2.  Pt will demo improved core strength as evidenced by performance and progression of core exercises without adverse effects and improved R hip ext strength to 5/5 MMT for improved performance of and tolerance with functional mobility and recreational activities.  Baseline:  Goal status: INITIAL  3.  Pt will report he is able to perform his normal standing activities including his volunteer activities without significant lumbar pain.  Baseline:  Goal status: INITIAL  4.  Pt will demo correct form and body mechanics with hip hinge and squatting to lift in order to perform his yard work and functional lifting with reduced stress and strain on his back. Baseline:  Goal status: INITIAL  5.  Pt will report improved tolerance/reduced pain with yard work.   Baseline:  Goal status: INITIAL    PLAN:  PT FREQUENCY: 1-2x/week  PT DURATION: 6 weeks  PLANNED INTERVENTIONS: Therapeutic exercises, Therapeutic activity, Neuromuscular re-education, Balance training, Gait training, Patient/Family education, Self Care, Joint mobilization, Stair training, Aquatic Therapy, Dry Needling, Electrical stimulation, Spinal mobilization, Cryotherapy, Moist heat, Taping, Traction, Ultrasound, Manual therapy, and Re-evaluation.  PLAN FOR NEXT SESSION: Review his current workout/stretching/home program.  Review and perform HEP.  Core strengthening, HS flexibility, and STW to lumbar.   Riki Altes, PTA  03/03/23 11:38 AM

## 2023-03-05 ENCOUNTER — Ambulatory Visit (HOSPITAL_BASED_OUTPATIENT_CLINIC_OR_DEPARTMENT_OTHER): Payer: 59 | Attending: Internal Medicine | Admitting: Physical Therapy

## 2023-03-05 DIAGNOSIS — M5459 Other low back pain: Secondary | ICD-10-CM | POA: Diagnosis present

## 2023-03-05 DIAGNOSIS — M6281 Muscle weakness (generalized): Secondary | ICD-10-CM | POA: Diagnosis present

## 2023-03-05 NOTE — Therapy (Incomplete)
OUTPATIENT PHYSICAL THERAPY THORACOLUMBAR TREATMENT   Patient Name: Andrew Nolan MRN: [id number] DOB:[birth year], 62 y.o., male Today's Date: 03/06/2023  END OF SESSION:  PT End of Session - 03/05/23 1500     Visit Number 3    Number of Visits 10    Date for PT Re-Evaluation 04/10/23    Authorization Type [employer]    PT Start Time 1455    PT Stop Time 1537    PT Time Calculation (min) 42 min    Activity Tolerance Patient tolerated treatment well    Behavior During Therapy WFL for tasks assessed/performed              Past Medical History:  Diagnosis Date   Allergy    Arthritis    ECRB (extensor carpi radialis brevis) tenosynovitis    GERD (gastroesophageal reflux disease)    occasional    Hypertension    Lateral epicondylitis of right elbow    Medial epicondylitis of right elbow    PONV (postoperative nausea and vomiting)    only with one surgery in 2005, no other problems with other surgeries   Shingles    Wears glasses    Past Surgical History:  Procedure Laterality Date   COLONOSCOPY  06/17/2009   dr Arlyce Dice    FRACTURE SURGERY     lt thumb-age 44   LATERAL EPICONDYLE RELEASE Right 11/11/2013   Procedure: RIGHT TENOTOMY ELBOW LATERAL EPICONDYLITIS TENNIS ELBOW/RIGHT ELBOW INJECTION ASPIRATION ARTHROCENTESIS INTERMEDULLARY JOINT BURSA;  Surgeon: Nilda Simmer, MD;  Location: [facility];  Service: Orthopedics;  Laterality: Right;   ORIF METACARPAL FRACTURE  1982   right hand   ORIF RADIUS & ULNA FRACTURES  1976   compound-right   ORIF WRIST FRACTURE  1975   left   SHOULDER ARTHROSCOPY W/ LABRAL REPAIR  1999   right   SHOULDER ARTHROSCOPY W/ ROTATOR CUFF REPAIR  2005   left   UPPER GI ENDOSCOPY     VASECTOMY     WISDOM TOOTH EXTRACTION     Patient Active Problem List   Diagnosis Date Noted   Pain due to onychomycosis of toenails of both feet 01/17/2023   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   Class 1 obesity due to excess calories with body  mass index (BMI) of 30.0 to 30.9 in adult 07/15/2021   Hypertension    Tinnitus of both ears 03/25/2021   Seasonal and perennial allergic rhinitis 12/09/2019   ECRB (extensor carpi radialis brevis) tenosynovitis    Medial epicondylitis of right elbow    Lateral epicondylitis of right elbow     PCP: Charlane Ferretti, DO  REFERRING PROVIDER: Charlane Ferretti, DO  REFERRING DIAG: M54.9 (ICD-10-CM) - Back Pain  Rationale for Evaluation and Treatment: Rehabilitation  THERAPY DIAG:  Other low back pain  Muscle weakness (generalized)  ONSET DATE: February 2024  SUBJECTIVE:  SUBJECTIVE STATEMENT:  Pt had no adverse effects after prior Rx.  He did notice some tenderness in his back when he laid down today to perform some exercises.  Pt worked out with his trainer today.  He did some core exercises, squatting, stretching, and sliders.  Pt states he tried to do a hip abd slider in standing though was unable to due to back pain.     PERTINENT HISTORY:  Chronic lumbar pain Arthritis, HTN, and cervical spondylosis PSHx:  Cervical Fusion 12/18/2019, L knee arthroscopic surgery for meniscus 01/19, R shoulder surgery in 1999 and L shoulder RCR in 2005, R elbow lateral epicondyle release in 2015, and R wrist surgery    PAIN:  Are you having pain? Yes NPRS:  1/10 current, 6-7/10 worst, 0/10 best Location:  R sided lumbar pain Pt has occasional numbness in L thigh.  He can have some pain traveling to groin and knee when it is really aggravated.    PRECAUTIONS: Other: cervical fusion  WEIGHT BEARING RESTRICTIONS: No  FALLS:  Has patient fallen in last 6 months? 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  PLOF: Independent.  Pt was able to play pickleball.  PATIENT GOALS: wants to be able to play pickleball.  To be able to ski without pain.  Decreased pain.    OBJECTIVE:   DIAGNOSTIC FINDINGS:  None  TODAY'S TREATMENT:                                                                                                                                 -STM to R lumbar paraspinals in prone to improve pain, soft tissue tightness, and myofascial adhesions and restrictions.   PT educated pt in correct performance and palpation of TrA contraction.  Pt performed TrA contraction with and without 5 sec hold Marching with TrA 2x5 PPT 2x10 Supine lumbar rotation x10 Supine HSS with strap 30sec x2 bilat Supine Piriformis stretch 30seconds x2 bilat Seated QL stretch 30 seconds x2 bilat    PATIENT EDUCATION:  Education details: dx, relevant anatomy, HEP, and POC.  PT answered pt's questions.   Person educated: Patient Education method: Explanation, Demonstration, Tactile cues, Verbal cues, and Handouts Education comprehension: verbalized understanding, returned demonstration, verbal cues required, and tactile cues required  HOME EXERCISE PROGRAM: Access Code: [email or web address] URL: [email or web address] Date: 02/27/2023 Prepared by: Aaron Edelman  Updated HEP: TrA contraction 1 set of 10 reps, 2x/day  ASSESSMENT:  CLINICAL IMPRESSION: PT educated and instructed pt in performing TrA contractions to improve core strength.  Pt requires cuing and instruction for correct form of TrA contraction.  Pt demonstrates improved form with cuing and increased repetitions.  He had difficulty performing marching with TrA correctly.  PT updated HEP with TrA contraction and gave  pt a HEP handout.  Pt does have tightness in HS and required cuing to decrease knee flexion and lower stretch for proper HS stretch.  Pt has soft tissue  tightness in R sided lumbar paraspinals.  He responded well to Rx stating he felt good after Rx and had no increased pain.  OBJECTIVE IMPAIRMENTS: decreased activity tolerance, decreased mobility, difficulty walking, decreased ROM, decreased strength, hypomobility, impaired flexibility, and pain.   ACTIVITY LIMITATIONS: bending, standing, and locomotion level  PARTICIPATION LIMITATIONS: yard work and recreational activities  PERSONAL FACTORS: Time since onset of injury/illness/exacerbation and 3+ comorbidities: arthritis, cervical fusion, and L knee arthroscopic surgery   are also affecting patient's functional outcome.   REHAB POTENTIAL: Good  CLINICAL DECISION MAKING: Stable/uncomplicated  EVALUATION COMPLEXITY: Low   GOALS:  SHORT TERM GOALS: Target date: 03/20/2023   Pt will be independent and compliant with HEP for improved pain, ROM, strength, and function.  Baseline: Goal status: INITIAL  2.  Pt will demo L lumbar rotation AROM to be [facility] for improved tightness and mobility.  Baseline:  Goal status: INITIAL  3.  Pt will report at least a 25% improvement in pain with standing activities.  Baseline:  Goal status: INITIAL    LONG TERM GOALS: Target date: 04/10/2023   Pt will demonstrate improved flexibility in HS in order to reduce strain on lumbar and improve mobility.  Baseline:  Goal status: INITIAL  2.  Pt will demo improved core strength as evidenced by performance and progression of core exercises without adverse effects and improved R hip ext strength to 5/5 MMT for improved performance of and tolerance with functional mobility and recreational activities.  Baseline:  Goal status: INITIAL  3.  Pt will report he is able to perform his normal standing activities including his volunteer activities without significant lumbar pain.  Baseline:  Goal status: INITIAL  4.  Pt will demo correct form and body mechanics with hip hinge and squatting to lift in order to  perform his yard work and functional lifting with reduced stress and strain on his back. Baseline:  Goal status: INITIAL  5.  Pt will report improved tolerance/reduced pain with yard work.   Baseline:  Goal status: INITIAL    PLAN:  PT FREQUENCY: 1-2x/week  PT DURATION: 6 weeks  PLANNED INTERVENTIONS: Therapeutic exercises, Therapeutic activity, Neuromuscular re-education, Balance training, Gait training, Patient/Family education, Self Care, Joint mobilization, Stair training, Aquatic Therapy, Dry Needling, Electrical stimulation, Spinal mobilization, Cryotherapy, Moist heat, Taping, Traction, Ultrasound, Manual therapy, and Re-evaluation.  PLAN FOR NEXT SESSION: Review his current workout/stretching/home program.  Review and perform HEP.  Core strengthening, HS flexibility, and STW to lumbar.   Audie Clear III PT, DPT 03/06/23 10:55 PM

## 2023-03-06 ENCOUNTER — Encounter (HOSPITAL_BASED_OUTPATIENT_CLINIC_OR_DEPARTMENT_OTHER): Payer: Self-pay | Admitting: Physical Therapy

## 2023-03-13 ENCOUNTER — Ambulatory Visit (HOSPITAL_BASED_OUTPATIENT_CLINIC_OR_DEPARTMENT_OTHER): Payer: 59 | Admitting: Physical Therapy

## 2023-03-15 ENCOUNTER — Encounter (HOSPITAL_BASED_OUTPATIENT_CLINIC_OR_DEPARTMENT_OTHER): Payer: 59 | Admitting: Physical Therapy

## 2023-03-20 ENCOUNTER — Ambulatory Visit (HOSPITAL_BASED_OUTPATIENT_CLINIC_OR_DEPARTMENT_OTHER): Payer: 59 | Admitting: Physical Therapy

## 2023-03-20 DIAGNOSIS — M5459 Other low back pain: Secondary | ICD-10-CM | POA: Diagnosis not present

## 2023-03-20 DIAGNOSIS — M6281 Muscle weakness (generalized): Secondary | ICD-10-CM

## 2023-03-20 NOTE — Therapy (Signed)
OUTPATIENT PHYSICAL THERAPY THORACOLUMBAR TREATMENT   Patient Name: Andrew Nolan MRN: [id number] DOB:02/19/1961, 62 y.o., male Today's Date: 03/21/2023  END OF SESSION:  PT End of Session - 03/20/23 1105     Visit Number 4    Number of Visits 10    Date for PT Re-Evaluation 04/10/23    Authorization Type UHC    PT Start Time 1030    PT Stop Time 1104    PT Time Calculation (min) 34 min    Activity Tolerance Patient tolerated treatment well    Behavior During Therapy WFL for tasks assessed/performed               Past Medical History:  Diagnosis Date   Allergy    Arthritis    ECRB (extensor carpi radialis brevis) tenosynovitis    GERD (gastroesophageal reflux disease)    occasional    Hypertension    Lateral epicondylitis of right elbow    Medial epicondylitis of right elbow    PONV (postoperative nausea and vomiting)    only with one surgery in 2005, no other problems with other surgeries   Shingles    Wears glasses    Past Surgical History:  Procedure Laterality Date   COLONOSCOPY  06/17/2009   dr Arlyce Dice    FRACTURE SURGERY     lt thumb-age 8   LATERAL EPICONDYLE RELEASE Right 11/11/2013   Procedure: RIGHT TENOTOMY ELBOW LATERAL EPICONDYLITIS TENNIS ELBOW/RIGHT ELBOW INJECTION ASPIRATION ARTHROCENTESIS INTERMEDULLARY JOINT BURSA;  Surgeon: Nilda Simmer, MD;  Location: [facility];  Service: Orthopedics;  Laterality: Right;   ORIF METACARPAL FRACTURE  1982   right hand   ORIF RADIUS & ULNA FRACTURES  1976   compound-right   ORIF WRIST FRACTURE  1975   left   SHOULDER ARTHROSCOPY W/ LABRAL REPAIR  1999   right   SHOULDER ARTHROSCOPY W/ ROTATOR CUFF REPAIR  2005   left   UPPER GI ENDOSCOPY     VASECTOMY     WISDOM TOOTH EXTRACTION     Patient Active Problem List   Diagnosis Date Noted   Pain due to onychomycosis of toenails of both feet 01/17/2023   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   Class 1 obesity due to excess calories with  body mass index (BMI) of 30.0 to 30.9 in adult 07/15/2021   Hypertension    Tinnitus of both ears 03/25/2021   Seasonal and perennial allergic rhinitis 12/09/2019   ECRB (extensor carpi radialis brevis) tenosynovitis    Medial epicondylitis of right elbow    Lateral epicondylitis of right elbow     PCP: Charlane Ferretti, DO  REFERRING PROVIDER: Charlane Ferretti, DO  REFERRING DIAG: M54.9 (ICD-10-CM) - Back Pain  Rationale for Evaluation and Treatment: Rehabilitation  THERAPY DIAG:  Other low back pain  Muscle weakness (generalized)  ONSET DATE: February 2024  SUBJECTIVE:  SUBJECTIVE STATEMENT:  Pt walked on trails yesterday on 2 occasions.  Pt reports he felt good after the 1st walk for 2.5 miles though had increased pain the 2nd walk for 1 mile.  Pt saw his trainer that AM and performed stretching.  Pt states his back is still tender.  Pt reports having soreness in his lumbar after prior Rx.    PERTINENT HISTORY:  Chronic lumbar pain Arthritis, HTN, and cervical spondylosis PSHx:  Cervical Fusion 12/18/2019, L knee arthroscopic surgery for meniscus 01/19, R shoulder surgery in 1999 and L shoulder RCR in 2005, R elbow lateral epicondyle release in 2015, and R wrist surgery    PAIN:  Are you having pain? Yes NPRS:  1-2/10 current, 6-7/10 worst, 0/10 best Location:  R sided lumbar pain Pt has occasional numbness in L thigh.  He can have some pain traveling to groin and knee when it is really aggravated.    PRECAUTIONS: Other: cervical fusion  WEIGHT BEARING RESTRICTIONS: No  FALLS:  Has patient fallen in last 6 months? 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  PLOF: Independent.  Pt was able to play pickleball.  PATIENT GOALS: wants to be able to play pickleball.  To be able to ski without pain.  Decreased pain.    OBJECTIVE:   DIAGNOSTIC FINDINGS:  None  TODAY'S TREATMENT:                                                                                                                                Manual Therapy: -STM to R lumbar paraspinals in prone to improve pain, soft tissue tightness, and myofascial adhesions and restrictions.   Therapeutic Exercise: Supine Marching with TrA x5, 10 reps Supine alt UE/LE with TrA 2x10 Supine alt LE extension 2x10 reps Supine shoulder ext with PPT 2x10 with 3# Supine lumbar rotation x10 Supine Piriformis stretch 30seconds x2 bilat    PATIENT EDUCATION:  Education details: dx, relevant anatomy, HEP, and POC.  Person educated: Patient Education method:  Explanation, Demonstration, Tactile cues, Verbal cues Education comprehension: verbalized understanding, returned demonstration, verbal cues required, and tactile cues required  HOME EXERCISE PROGRAM: Access Code: [email or web address] URL: [email or web address] Date: 02/27/2023 Prepared by: Aaron Edelman   ASSESSMENT:  CLINICAL IMPRESSION: Pt has improved with performance of TrA contraction.  He performed exercises well with cuing and instruction for correct form and positioning.  PT performed STM to R sided lumbar paraspinals to improve pain and soft tissue mobility and to reduce tightness.  He tolerated STM well and responded well to Rx reporting reduced pain after Rx.  OBJECTIVE IMPAIRMENTS: decreased activity tolerance, decreased mobility, difficulty walking, decreased ROM, decreased strength, hypomobility, impaired flexibility, and pain.   ACTIVITY LIMITATIONS: bending, standing, and locomotion level  PARTICIPATION LIMITATIONS:  yard work and recreational activities  PERSONAL FACTORS: Time since  onset of injury/illness/exacerbation and 3+ comorbidities: arthritis, cervical fusion, and L knee arthroscopic surgery   are also affecting patient's functional outcome.   REHAB POTENTIAL: Good  CLINICAL DECISION MAKING: Stable/uncomplicated  EVALUATION COMPLEXITY: Low   GOALS:  SHORT TERM GOALS: Target date: 03/20/2023   Pt will be independent and compliant with HEP for improved pain, ROM, strength, and function.  Baseline: Goal status: INITIAL  2.  Pt will demo L lumbar rotation AROM to be [facility] for improved tightness and mobility.  Baseline:  Goal status: INITIAL  3.  Pt will report at least a 25% improvement in pain with standing activities.  Baseline:  Goal status: INITIAL    LONG TERM GOALS: Target date: 04/10/2023   Pt will demonstrate improved flexibility in HS in order to reduce strain on lumbar and improve mobility.  Baseline:  Goal status: INITIAL  2.  Pt will demo improved core strength as evidenced by performance and progression of core exercises without adverse effects and improved R hip ext strength to 5/5 MMT for improved performance of and tolerance with functional mobility and recreational activities.  Baseline:  Goal status: INITIAL  3.  Pt will report he is able to perform his normal standing activities including his volunteer activities without significant lumbar pain.  Baseline:  Goal status: INITIAL  4.  Pt will demo correct form and body mechanics with hip hinge and squatting to lift in order to perform his yard work and functional lifting with reduced stress and strain on his back. Baseline:  Goal status: INITIAL  5.  Pt will report improved tolerance/reduced pain with yard work.   Baseline:  Goal status: INITIAL    PLAN:  PT FREQUENCY: 1-2x/week  PT DURATION: 6 weeks  PLANNED INTERVENTIONS: Therapeutic exercises, Therapeutic activity, Neuromuscular re-education, Balance training, Gait training, Patient/Family education, Self Care, Joint  mobilization, Stair training, Aquatic Therapy, Dry Needling, Electrical stimulation, Spinal mobilization, Cryotherapy, Moist heat, Taping, Traction, Ultrasound, Manual therapy, and Re-evaluation.  PLAN FOR NEXT SESSION: Review his current workout/stretching/home program.  Review and perform HEP.  Core strengthening, HS flexibility, and STW to lumbar.   Audie Clear III PT, DPT 03/21/23 5:14 PM

## 2023-03-21 ENCOUNTER — Encounter (HOSPITAL_BASED_OUTPATIENT_CLINIC_OR_DEPARTMENT_OTHER): Payer: Self-pay | Admitting: Physical Therapy

## 2023-03-22 ENCOUNTER — Ambulatory Visit (HOSPITAL_BASED_OUTPATIENT_CLINIC_OR_DEPARTMENT_OTHER): Payer: 59

## 2023-03-22 ENCOUNTER — Encounter (HOSPITAL_BASED_OUTPATIENT_CLINIC_OR_DEPARTMENT_OTHER): Payer: Self-pay

## 2023-03-22 DIAGNOSIS — M5459 Other low back pain: Secondary | ICD-10-CM | POA: Diagnosis not present

## 2023-03-22 DIAGNOSIS — M6281 Muscle weakness (generalized): Secondary | ICD-10-CM

## 2023-03-22 NOTE — Therapy (Signed)
OUTPATIENT PHYSICAL THERAPY THORACOLUMBAR TREATMENT   Patient Name: Andrew Nolan MRN: [id number] DOB:1960/10/05, 62 y.o., male Today's Date: 03/22/2023  END OF SESSION:  PT End of Session - 03/22/23 1435     Visit Number 5    Number of Visits 10    Date for PT Re-Evaluation 04/10/23    Authorization Type UHC    PT Start Time 1434    PT Stop Time 1515    PT Time Calculation (min) 41 min    Activity Tolerance Patient tolerated treatment well    Behavior During Therapy WFL for tasks assessed/performed                Past Medical History:  Diagnosis Date   Allergy    Arthritis    ECRB (extensor carpi radialis brevis) tenosynovitis    GERD (gastroesophageal reflux disease)    occasional    Hypertension    Lateral epicondylitis of right elbow    Medial epicondylitis of right elbow    PONV (postoperative nausea and vomiting)    only with one surgery in 2005, no other problems with other surgeries   Shingles    Wears glasses    Past Surgical History:  Procedure Laterality Date   COLONOSCOPY  06/17/2009   dr Arlyce Dice    FRACTURE SURGERY     lt thumb-age 62   LATERAL EPICONDYLE RELEASE Right 11/11/2013   Procedure: RIGHT TENOTOMY ELBOW LATERAL EPICONDYLITIS TENNIS ELBOW/RIGHT ELBOW INJECTION ASPIRATION ARTHROCENTESIS INTERMEDULLARY JOINT BURSA;  Surgeon: Nilda Simmer, MD;  Location: [facility];  Service: Orthopedics;  Laterality: Right;   ORIF METACARPAL FRACTURE  1982   right hand   ORIF RADIUS & ULNA FRACTURES  1976   compound-right   ORIF WRIST FRACTURE  1975   left   SHOULDER ARTHROSCOPY W/ LABRAL REPAIR  1999   right   SHOULDER ARTHROSCOPY W/ ROTATOR CUFF REPAIR  2005   left   UPPER GI ENDOSCOPY     VASECTOMY     WISDOM TOOTH EXTRACTION     Patient Active Problem List   Diagnosis Date Noted   Pain due to onychomycosis of toenails of both feet 01/17/2023   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   Class 1 obesity due to excess calories with  body mass index (BMI) of 30.0 to 30.9 in adult 07/15/2021   Hypertension    Tinnitus of both ears 03/25/2021   Seasonal and perennial allergic rhinitis 12/09/2019   ECRB (extensor carpi radialis brevis) tenosynovitis    Medial epicondylitis of right elbow    Lateral epicondylitis of right elbow     PCP: Charlane Ferretti, DO  REFERRING PROVIDER: Charlane Ferretti, DO  REFERRING DIAG: M54.9 (ICD-10-CM) - Back Pain  Rationale for Evaluation and Treatment: Rehabilitation  THERAPY DIAG:  Other low back pain  Muscle weakness (generalized)  ONSET DATE: February 2024  SUBJECTIVE:  SUBJECTIVE STATEMENT:  Went walking yesterday with his trainer who gave him some pointers to help with his back tightness. "It helped and my back feels pretty good today."  PERTINENT HISTORY:  Chronic lumbar pain Arthritis, HTN, and cervical spondylosis PSHx:  Cervical Fusion 12/18/2019, L knee arthroscopic surgery for meniscus 01/19, R shoulder surgery in 1999 and L shoulder RCR in 2005, R elbow lateral epicondyle release in 2015, and R wrist surgery    PAIN:  Are you having pain? Yes NPRS:  0/10 current, 6-7/10 worst, 0/10 best Location:  R sided lumbar pain Pt has occasional numbness in L thigh.  He can have some pain traveling to groin and knee when it is really aggravated.    PRECAUTIONS: Other: cervical fusion  WEIGHT BEARING RESTRICTIONS: No  FALLS:  Has patient fallen in last 6 months? 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  Pt was able to play pickleball.  PATIENT GOALS: wants to be able to play pickleball.  To be able to ski  without pain.  Decreased pain.    OBJECTIVE:   DIAGNOSTIC FINDINGS:  None  TODAY'S TREATMENT:                                                                                                                                Manual Therapy: -STM to R lumbar paraspinals and QL in prone to improve pain, soft tissue tightness, and myofascial adhesions and restrictions.   Therapeutic Exercise:  Supine alt UE/LE with TrA 2x10 Supine shoulder ext with PPT 2x10 with 3# Supine lumbar rotation x15 Supine Piriformis stretch 30seconds x4 bilat Prone hip extension 2x10ea Sidleying abduction 2x10ea    PATIENT EDUCATION:  Education details: dx, relevant anatomy, HEP, and POC.  Person educated: Patient Education method:  Explanation, Demonstration, Tactile cues, Verbal cues Education comprehension: verbalized understanding, returned demonstration, verbal cues required, and tactile cues required  HOME EXERCISE PROGRAM: Access Code: [email or web address] URL: [email or web address] Date: 02/27/2023 Prepared by: Aaron Edelman   ASSESSMENT:  CLINICAL IMPRESSION: Pt continues to demonstrate adequate TrA activation with gentle PPT. Pt reported greater weakness with prone L hip extension vs R. Pt denied challenge with sidelying hip abduction. No back pain by end of session.   OBJECTIVE IMPAIRMENTS: decreased activity tolerance, decreased mobility, difficulty walking, decreased ROM, decreased strength, hypomobility, impaired flexibility, and pain.   ACTIVITY LIMITATIONS: bending, standing, and locomotion level  PARTICIPATION LIMITATIONS: yard work and recreational activities  PERSONAL FACTORS: Time since onset of injury/illness/exacerbation and 3+ comorbidities: arthritis, cervical fusion, and L knee arthroscopic surgery   are also affecting patient's functional outcome.   REHAB POTENTIAL: Good  CLINICAL DECISION MAKING: Stable/uncomplicated  EVALUATION COMPLEXITY:  Low   GOALS:  SHORT TERM GOALS: Target date: 03/20/2023   Pt  will be independent and compliant with HEP for improved pain, ROM, strength, and function.  Baseline: Goal status: INITIAL  2.  Pt will demo L lumbar rotation AROM to be [facility] for improved tightness and mobility.  Baseline:  Goal status: INITIAL  3.  Pt will report at least a 25% improvement in pain with standing activities.  Baseline:  Goal status: INITIAL    LONG TERM GOALS: Target date: 04/10/2023   Pt will demonstrate improved flexibility in HS in order to reduce strain on lumbar and improve mobility.  Baseline:  Goal status: INITIAL  2.  Pt will demo improved core strength as evidenced by performance and progression of core exercises without adverse effects and improved R hip ext strength to 5/5 MMT for improved performance of and tolerance with functional mobility and recreational activities.  Baseline:  Goal status: INITIAL  3.  Pt will report he is able to perform his normal standing activities including his volunteer activities without significant lumbar pain.  Baseline:  Goal status: INITIAL  4.  Pt will demo correct form and body mechanics with hip hinge and squatting to lift in order to perform his yard work and functional lifting with reduced stress and strain on his back. Baseline:  Goal status: INITIAL  5.  Pt will report improved tolerance/reduced pain with yard work.   Baseline:  Goal status: INITIAL    PLAN:  PT FREQUENCY: 1-2x/week  PT DURATION: 6 weeks  PLANNED INTERVENTIONS: Therapeutic exercises, Therapeutic activity, Neuromuscular re-education, Balance training, Gait training, Patient/Family education, Self Care, Joint mobilization, Stair training, Aquatic Therapy, Dry Needling, Electrical stimulation, Spinal mobilization, Cryotherapy, Moist heat, Taping, Traction, Ultrasound, Manual therapy, and Re-evaluation.  PLAN FOR NEXT SESSION: Review his current workout/stretching/home  program.  Review and perform HEP.  Core strengthening, HS flexibility, and STW to lumbar.   Riki Altes, PTA  03/22/23 3:23 PM

## 2023-03-27 ENCOUNTER — Ambulatory Visit (HOSPITAL_BASED_OUTPATIENT_CLINIC_OR_DEPARTMENT_OTHER): Payer: 59 | Admitting: Physical Therapy

## 2023-03-27 DIAGNOSIS — M6281 Muscle weakness (generalized): Secondary | ICD-10-CM

## 2023-03-27 DIAGNOSIS — M5459 Other low back pain: Secondary | ICD-10-CM

## 2023-03-27 NOTE — Therapy (Signed)
OUTPATIENT PHYSICAL THERAPY THORACOLUMBAR TREATMENT   Patient Name: Andrew Nolan MRN: [id number] DOB:1961/08/02, 62 y.o., male Today's Date: 03/28/2023  END OF SESSION:  PT End of Session - 03/27/23 1155     Visit Number 6    Number of Visits 10    Date for PT Re-Evaluation 04/10/23    Authorization Type UHC    PT Start Time 1152    PT Stop Time 1234    PT Time Calculation (min) 42 min    Activity Tolerance Patient tolerated treatment well;No increased pain    Behavior During Therapy WFL for tasks assessed/performed                Past Medical History:  Diagnosis Date   Allergy    Arthritis    ECRB (extensor carpi radialis brevis) tenosynovitis    GERD (gastroesophageal reflux disease)    occasional    Hypertension    Lateral epicondylitis of right elbow    Medial epicondylitis of right elbow    PONV (postoperative nausea and vomiting)    only with one surgery in 2005, no other problems with other surgeries   Shingles    Wears glasses    Past Surgical History:  Procedure Laterality Date   COLONOSCOPY  06/17/2009   dr Arlyce Dice    FRACTURE SURGERY     lt thumb-age 50   LATERAL EPICONDYLE RELEASE Right 11/11/2013   Procedure: RIGHT TENOTOMY ELBOW LATERAL EPICONDYLITIS TENNIS ELBOW/RIGHT ELBOW INJECTION ASPIRATION ARTHROCENTESIS INTERMEDULLARY JOINT BURSA;  Surgeon: Nilda Simmer, MD;  Location: [facility];  Service: Orthopedics;  Laterality: Right;   ORIF METACARPAL FRACTURE  1982   right hand   ORIF RADIUS & ULNA FRACTURES  1976   compound-right   ORIF WRIST FRACTURE  1975   left   SHOULDER ARTHROSCOPY W/ LABRAL REPAIR  1999   right   SHOULDER ARTHROSCOPY W/ ROTATOR CUFF REPAIR  2005   left   UPPER GI ENDOSCOPY     VASECTOMY     WISDOM TOOTH EXTRACTION     Patient Active Problem List   Diagnosis Date Noted   Pain due to onychomycosis of toenails of both feet 01/17/2023   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   Class 1 obesity due to  excess calories with body mass index (BMI) of 30.0 to 30.9 in adult 07/15/2021   Hypertension    Tinnitus of both ears 03/25/2021   Seasonal and perennial allergic rhinitis 12/09/2019   ECRB (extensor carpi radialis brevis) tenosynovitis    Medial epicondylitis of right elbow    Lateral epicondylitis of right elbow     PCP: Charlane Ferretti, DO  REFERRING PROVIDER: Charlane Ferretti, DO  REFERRING DIAG: M54.9 (ICD-10-CM) - Back Pain  Rationale for Evaluation and Treatment: Rehabilitation  THERAPY DIAG:  Other low back pain  Muscle weakness (generalized)  ONSET DATE: February 2024  SUBJECTIVE:  SUBJECTIVE STATEMENT:  Pt states he did a lot of walking this weekend.  He performed his walking program walking 2.5 miles at a time.  He states he felt fine and the key is stretching.  He stretched before walking and stopped at times during his walk to perform stretching.    PERTINENT HISTORY:  Chronic lumbar pain Arthritis, HTN, and cervical spondylosis PSHx:  Cervical Fusion 12/18/2019, L knee arthroscopic surgery for meniscus 01/19, R shoulder surgery in 1999 and L shoulder RCR in 2005, R elbow lateral epicondyle release in 2015, and R wrist surgery    PAIN:  Are you having pain? Yes NPRS:  1/10 current, 6-7/10 worst, 0/10 best Location:  R sided lumbar pain Pt has occasional numbness in L thigh.  He can have some pain traveling to groin and knee when it is really aggravated.    PRECAUTIONS: Other: cervical fusion  WEIGHT BEARING RESTRICTIONS: No  FALLS:  Has patient fallen in last 6 months? 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  Pt was able to play pickleball.  PATIENT GOALS: wants to be able to play pickleball.  To be able to ski without pain.  Decreased pain.    OBJECTIVE:   DIAGNOSTIC FINDINGS:  None  TODAY'S TREATMENT:                                                                                                                                Manual Therapy: -STM to R lumbar paraspinals and QL in prone to improve pain, soft tissue tightness, and myofascial adhesions and restrictions.   Therapeutic Exercise:  Supine alt UE/LE with TrA 2x10 Supine alt LE extension while holding 3# with TrA 2x10 Supine shoulder ext with PPT 2x10 with 3# Supine hip extension arc SL x5 reps bilat with TrA Supine lumbar rotation x15 Prone hip extension x10ea Sidleying abduction 2x10ea Qped alt UE raises with TrA x10 Qped alt LE ext with TrA x10    PATIENT EDUCATION:  Education details: dx, relevant anatomy, HEP, and POC.  Person educated: Patient Education method:  Explanation, Demonstration, Tactile cues, Verbal cues Education comprehension: verbalized understanding, returned demonstration, verbal cues required, and tactile cues required  HOME EXERCISE PROGRAM: Access Code: [email or web address] URL: [email or web address] Date: 02/27/2023 Prepared by: Aaron Edelman   ASSESSMENT:  CLINICAL IMPRESSION: Pt performed exercises focusing on core and hip strengthening.  He performed exercises well with cuing and instruction in correct form. Pt was challenged with S/L hip abd and required cuing for correct positioning.  Pt felt pressure in lumbar with SL hip extension arc in supine and was challenged with exercise.  Pt only performed 5 reps due to pressure in lumbar.  Pt tolerated STM well.  He responded well to Rx having  no increased pain after Rx.  Pt should benefit from cont skilled PT services to address ongoing goals and to improve overall function.    OBJECTIVE IMPAIRMENTS: decreased activity  tolerance, decreased mobility, difficulty walking, decreased ROM, decreased strength, hypomobility, impaired flexibility, and pain.   ACTIVITY LIMITATIONS: bending, standing, and locomotion level  PARTICIPATION LIMITATIONS: yard work and recreational activities  PERSONAL FACTORS: Time since onset of injury/illness/exacerbation and 3+ comorbidities: arthritis, cervical fusion, and L knee arthroscopic surgery   are also affecting patient's functional outcome.   REHAB POTENTIAL: Good  CLINICAL DECISION MAKING: Stable/uncomplicated  EVALUATION COMPLEXITY: Low   GOALS:  SHORT TERM GOALS: Target date: 03/20/2023   Pt will be independent and compliant with HEP for improved pain, ROM, strength, and function.  Baseline: Goal status: INITIAL  2.  Pt will demo L lumbar rotation AROM to be [facility] for improved tightness and mobility.  Baseline:  Goal status: INITIAL  3.  Pt will report at least a 25% improvement in pain with standing activities.  Baseline:  Goal status: INITIAL    LONG TERM GOALS: Target date: 04/10/2023   Pt will demonstrate improved flexibility in HS in order to reduce strain on lumbar and improve mobility.  Baseline:  Goal status: INITIAL  2.  Pt will demo improved core strength as evidenced by performance and progression of core exercises without adverse effects and improved R hip ext strength to 5/5 MMT for improved performance of and tolerance with functional mobility and recreational activities.  Baseline:  Goal status: INITIAL  3.  Pt will report he is able to perform his normal standing activities including his volunteer activities without significant lumbar pain.  Baseline:  Goal status: INITIAL  4.  Pt will demo correct form and body mechanics with hip hinge and squatting to lift in order to perform his yard work and functional lifting with reduced stress and strain on his back. Baseline:  Goal status: INITIAL  5.  Pt will report improved  tolerance/reduced pain with yard work.   Baseline:  Goal status: INITIAL    PLAN:  PT FREQUENCY: 1-2x/week  PT DURATION: 6 weeks  PLANNED INTERVENTIONS: Therapeutic exercises, Therapeutic activity, Neuromuscular re-education, Balance training, Gait training, Patient/Family education, Self Care, Joint mobilization, Stair training, Aquatic Therapy, Dry Needling, Electrical stimulation, Spinal mobilization, Cryotherapy, Moist heat, Taping, Traction, Ultrasound, Manual therapy, and Re-evaluation.  PLAN FOR NEXT SESSION: Cont with core strengthening, HS flexibility, and STW to lumbar.   Audie Clear III PT, DPT 03/28/23 5:52 PM

## 2023-03-28 ENCOUNTER — Encounter (HOSPITAL_BASED_OUTPATIENT_CLINIC_OR_DEPARTMENT_OTHER): Payer: Self-pay | Admitting: Physical Therapy

## 2023-03-29 ENCOUNTER — Encounter (HOSPITAL_BASED_OUTPATIENT_CLINIC_OR_DEPARTMENT_OTHER): Payer: Self-pay

## 2023-03-29 ENCOUNTER — Ambulatory Visit (HOSPITAL_BASED_OUTPATIENT_CLINIC_OR_DEPARTMENT_OTHER): Payer: 59

## 2023-03-29 DIAGNOSIS — M5459 Other low back pain: Secondary | ICD-10-CM | POA: Diagnosis not present

## 2023-03-29 DIAGNOSIS — M6281 Muscle weakness (generalized): Secondary | ICD-10-CM

## 2023-03-29 NOTE — Therapy (Signed)
OUTPATIENT PHYSICAL THERAPY THORACOLUMBAR TREATMENT   Patient Name: Andrew Nolan MRN: [id number] DOB:December 27, 1960, 62 y.o., male Today's Date: 03/29/2023  END OF SESSION:  PT End of Session - 03/29/23 1159     Visit Number 7    Number of Visits 10    Date for PT Re-Evaluation 04/10/23    Authorization Type [employer]    PT Start Time 1149    PT Stop Time 1230    PT Time Calculation (min) 41 min    Activity Tolerance Patient tolerated treatment well    Behavior During Therapy WFL for tasks assessed/performed                 Past Medical History:  Diagnosis Date   Allergy    Arthritis    ECRB (extensor carpi radialis brevis) tenosynovitis    GERD (gastroesophageal reflux disease)    occasional    Hypertension    Lateral epicondylitis of right elbow    Medial epicondylitis of right elbow    PONV (postoperative nausea and vomiting)    only with one surgery in 2005, no other problems with other surgeries   Shingles    Wears glasses    Past Surgical History:  Procedure Laterality Date   COLONOSCOPY  06/17/2009   dr Arlyce Dice    FRACTURE SURGERY     lt thumb-age 46   LATERAL EPICONDYLE RELEASE Right 11/11/2013   Procedure: RIGHT TENOTOMY ELBOW LATERAL EPICONDYLITIS TENNIS ELBOW/RIGHT ELBOW INJECTION ASPIRATION ARTHROCENTESIS INTERMEDULLARY JOINT BURSA;  Surgeon: Nilda Simmer, MD;  Location: [facility];  Service: Orthopedics;  Laterality: Right;   ORIF METACARPAL FRACTURE  1982   right hand   ORIF RADIUS & ULNA FRACTURES  1976   compound-right   ORIF WRIST FRACTURE  1975   left   SHOULDER ARTHROSCOPY W/ LABRAL REPAIR  1999   right   SHOULDER ARTHROSCOPY W/ ROTATOR CUFF REPAIR  2005   left   UPPER GI ENDOSCOPY     VASECTOMY     WISDOM TOOTH EXTRACTION     Patient Active Problem List   Diagnosis Date Noted   Pain due to onychomycosis of toenails of both feet 01/17/2023   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   Class 1 obesity due to excess calories with  body mass index (BMI) of 30.0 to 30.9 in adult 07/15/2021   Hypertension    Tinnitus of both ears 03/25/2021   Seasonal and perennial allergic rhinitis 12/09/2019   ECRB (extensor carpi radialis brevis) tenosynovitis    Medial epicondylitis of right elbow    Lateral epicondylitis of right elbow     PCP: Charlane Ferretti, DO  REFERRING PROVIDER: Charlane Ferretti, DO  REFERRING DIAG: M54.9 (ICD-10-CM) - Back Pain  Rationale for Evaluation and Treatment: Rehabilitation  THERAPY DIAG:  Other low back pain  Muscle weakness (generalized)  ONSET DATE: February 2024  SUBJECTIVE:  SUBJECTIVE STATEMENT:  Pt reports his pain has been controlled through stretching and his HEP. Able to walk 2 miles without any issue.   PERTINENT HISTORY:  Chronic lumbar pain Arthritis, HTN, and cervical spondylosis PSHx:  Cervical Fusion 12/18/2019, L knee arthroscopic surgery for meniscus 01/19, R shoulder surgery in 1999 and L shoulder RCR in 2005, R elbow lateral epicondyle release in 2015, and R wrist surgery    PAIN:  Are you having pain? Yes NPRS:  1/10 current, 6-7/10 worst, 0/10 best Location:  R sided lumbar pain Pt has occasional numbness in L thigh.  He can have some pain traveling to groin and knee when it is really aggravated.    PRECAUTIONS: Other: cervical fusion  WEIGHT BEARING RESTRICTIONS: No  FALLS:  Has patient fallen in last 6 months? 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  Pt was able to play pickleball.  PATIENT GOALS: wants to be able to play pickleball.  To be able to ski without pain.  Decreased  pain.    OBJECTIVE:   DIAGNOSTIC FINDINGS:  None  TODAY'S TREATMENT:                                                                                                                                Therapeutic Exercise:  Staggered bridge 3" hold x10ea Supine SLR with TrA Supine alt UE/LE with TrA 2x10  Supine shoulder ext with PPT 2x10 with 3#  Supine lumbar rotation x15 Prone hip extension 2x10ea Sidleying abduction 3x10ea Standing lateral flexion with 10lb KB 2x10ea Standing PNF chops 4# DB 2x10ea     PATIENT EDUCATION:  Education details: dx, relevant anatomy, HEP, and POC.  Person educated: Patient Education method:  Explanation, Demonstration, Tactile cues, Verbal cues Education comprehension: verbalized understanding, returned demonstration, verbal cues required, and tactile cues required  HOME EXERCISE PROGRAM: Access Code: [email or web address] URL: [email or web address] Date: 02/27/2023 Prepared by: Aaron Edelman   ASSESSMENT:  CLINICAL IMPRESSION: Pt remains challenged by progressions to core exercises on plinth, so modified tasks as needed to prevent back discomfort and preserve technique. He was able to complete supine SLR with TrA, citing fatigue in TrA; however, no discomfort in low back. He was instructed to progress to sidleying hip abduction at home vs clamshells as he demonstrates improve strength/endurance in lateral hip musculature. Will continue to progress as tolerated.     OBJECTIVE IMPAIRMENTS: decreased activity tolerance, decreased mobility, difficulty walking, decreased ROM, decreased strength, hypomobility, impaired flexibility, and pain.   ACTIVITY LIMITATIONS: bending, standing, and locomotion level  PARTICIPATION LIMITATIONS: yard work and recreational activities  PERSONAL FACTORS: Time since onset of injury/illness/exacerbation and 3+ comorbidities: arthritis, cervical fusion, and L knee arthroscopic surgery   are also affecting  patient's functional outcome.  REHAB POTENTIAL: Good  CLINICAL DECISION MAKING: Stable/uncomplicated  EVALUATION COMPLEXITY: Low   GOALS:  SHORT TERM GOALS: Target date: 03/20/2023   Pt will be independent and compliant with HEP for improved pain, ROM, strength, and function.  Baseline: Goal status: INITIAL  2.  Pt will demo L lumbar rotation AROM to be [facility] for improved tightness and mobility.  Baseline:  Goal status: INITIAL  3.  Pt will report at least a 25% improvement in pain with standing activities.  Baseline:  Goal status: INITIAL    LONG TERM GOALS: Target date: 04/10/2023   Pt will demonstrate improved flexibility in HS in order to reduce strain on lumbar and improve mobility.  Baseline:  Goal status: INITIAL  2.  Pt will demo improved core strength as evidenced by performance and progression of core exercises without adverse effects and improved R hip ext strength to 5/5 MMT for improved performance of and tolerance with functional mobility and recreational activities.  Baseline:  Goal status: INITIAL  3.  Pt will report he is able to perform his normal standing activities including his volunteer activities without significant lumbar pain.  Baseline:  Goal status: INITIAL  4.  Pt will demo correct form and body mechanics with hip hinge and squatting to lift in order to perform his yard work and functional lifting with reduced stress and strain on his back. Baseline:  Goal status: INITIAL  5.  Pt will report improved tolerance/reduced pain with yard work.   Baseline:  Goal status: INITIAL    PLAN:  PT FREQUENCY: 1-2x/week  PT DURATION: 6 weeks  PLANNED INTERVENTIONS: Therapeutic exercises, Therapeutic activity, Neuromuscular re-education, Balance training, Gait training, Patient/Family education, Self Care, Joint mobilization, Stair training, Aquatic Therapy, Dry Needling, Electrical stimulation, Spinal mobilization, Cryotherapy, Moist heat, Taping,  Traction, Ultrasound, Manual therapy, and Re-evaluation.  PLAN FOR NEXT SESSION: Cont with core strengthening, HS flexibility, and STW to lumbar.   Riki Altes, PTA  03/29/23 1:17 PM

## 2023-04-03 ENCOUNTER — Encounter (HOSPITAL_BASED_OUTPATIENT_CLINIC_OR_DEPARTMENT_OTHER): Payer: Self-pay

## 2023-04-03 ENCOUNTER — Ambulatory Visit (HOSPITAL_BASED_OUTPATIENT_CLINIC_OR_DEPARTMENT_OTHER): Payer: 59

## 2023-04-03 DIAGNOSIS — M5459 Other low back pain: Secondary | ICD-10-CM

## 2023-04-03 DIAGNOSIS — M6281 Muscle weakness (generalized): Secondary | ICD-10-CM

## 2023-04-03 NOTE — Therapy (Signed)
OUTPATIENT PHYSICAL THERAPY THORACOLUMBAR TREATMENT   Patient Name: Andrew Nolan MRN: [id number] DOB:05/06/1961, 62 y.o., male Today's Date: 04/03/2023  END OF SESSION:  PT End of Session - 04/03/23 1306     Visit Number 8    Number of Visits 10    Date for PT Re-Evaluation 04/10/23    Authorization Type U[employer]    PT Start Time 1302    PT Stop Time 1345    PT Time Calculation (min) 43 min    Activity Tolerance Patient tolerated treatment well    Behavior During Therapy WFL for tasks assessed/performed                  Past Medical History:  Diagnosis Date   Allergy    Arthritis    ECRB (extensor carpi radialis brevis) tenosynovitis    GERD (gastroesophageal reflux disease)    occasional    Hypertension    Lateral epicondylitis of right elbow    Medial epicondylitis of right elbow    PONV (postoperative nausea and vomiting)    only with one surgery in 2005, no other problems with other surgeries   Shingles    Wears glasses    Past Surgical History:  Procedure Laterality Date   COLONOSCOPY  06/17/2009   dr Arlyce Dice    FRACTURE SURGERY     lt thumb-age 78   LATERAL EPICONDYLE RELEASE Right 11/11/2013   Procedure: RIGHT TENOTOMY ELBOW LATERAL EPICONDYLITIS TENNIS ELBOW/RIGHT ELBOW INJECTION ASPIRATION ARTHROCENTESIS INTERMEDULLARY JOINT BURSA;  Surgeon: Nilda Simmer, MD;  Location: [facility];  Service: Orthopedics;  Laterality: Right;   ORIF METACARPAL FRACTURE  1982   right hand   ORIF RADIUS & ULNA FRACTURES  1976   compound-right   ORIF WRIST FRACTURE  1975   left   SHOULDER ARTHROSCOPY W/ LABRAL REPAIR  1999   right   SHOULDER ARTHROSCOPY W/ ROTATOR CUFF REPAIR  2005   left   UPPER GI ENDOSCOPY     VASECTOMY     WISDOM TOOTH EXTRACTION     Patient Active Problem List   Diagnosis Date Noted   Pain due to onychomycosis of toenails of both feet 01/17/2023   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   Class 1 obesity due to excess calories  with body mass index (BMI) of 30.0 to 30.9 in adult 07/15/2021   Hypertension    Tinnitus of both ears 03/25/2021   Seasonal and perennial allergic rhinitis 12/09/2019   ECRB (extensor carpi radialis brevis) tenosynovitis    Medial epicondylitis of right elbow    Lateral epicondylitis of right elbow     PCP: Charlane Ferretti, DO  REFERRING PROVIDER: Charlane Ferretti, DO  REFERRING DIAG: M54.9 (ICD-10-CM) - Back Pain  Rationale for Evaluation and Treatment: Rehabilitation  THERAPY DIAG:  Other low back pain  Muscle weakness (generalized)  ONSET DATE: February 2024  SUBJECTIVE:  SUBJECTIVE STATEMENT:  Pt reports his pain has been controlled through stretching and his HEP. Able to walk 2 miles without any issue.   PERTINENT HISTORY:  Chronic lumbar pain Arthritis, HTN, and cervical spondylosis PSHx:  Cervical Fusion 12/18/2019, L knee arthroscopic surgery for meniscus 01/19, R shoulder surgery in 1999 and L shoulder RCR in 2005, R elbow lateral epicondyle release in 2015, and R wrist surgery    PAIN:  Are you having pain? Yes NPRS:  1/10 current, 6-7/10 worst, 0/10 best Location:  R sided lumbar pain Pt has occasional numbness in L thigh.  He can have some pain traveling to groin and knee when it is really aggravated.    PRECAUTIONS: Other: cervical fusion  WEIGHT BEARING RESTRICTIONS: No  FALLS:  Has patient fallen in last 6 months? 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  Pt was able to play pickleball.  PATIENT GOALS: wants to be able to play pickleball.  To be able to ski without pain.  Decreased  pain.    OBJECTIVE:   LUMBAR ROM:    AROM eval 7/30  Flexion WFL WFL  Extension WFL with little pain   Right lateral flexion Just proximal to  lateral knee jt line Just proximal to laterl knee jt line Mild tightness  Left lateral flexion Just proximal to  lateral knee jt line with tightness and little pain Just proximal to lateral knee jt line  Right rotation [facility] WFL  Left rotation 75% WFL   (Blank rows = not tested)  DIAGNOSTIC FINDINGS:  None  TODAY'S TREATMENT:                                                                                                                                Therapeutic Exercise:  Staggered bridge 3" hold x10ea Supine SLR with TrA 2x10 Piriformis stretch 3x30s ea  Supine shoulder ext with PPT 2x10 with 3#  Supine lumbar rotation x15 Prone hip extension 2x10ea Sidleying abduction 2x10ea Standing lateral flexion with 10lb KB 2x10ea Resisted cable walking: retro x2, fwd x1, lateral x1 ea 30lbs     PATIENT EDUCATION:  Education details: dx, relevant anatomy, HEP, and POC.  Person educated: Patient Education method:  Explanation, Demonstration, Tactile cues, Verbal cues Education comprehension: verbalized understanding, returned demonstration, verbal cues required, and tactile cues required  HOME EXERCISE PROGRAM: Access Code: [email or web address] URL: [email or web address] Date: 02/27/2023 Prepared by: Aaron Edelman   ASSESSMENT:  CLINICAL IMPRESSION: Pt remains easily fatigued by sideling hip abduction, though demonstrates good form without cuing required. Added resisted cable walking to work on hip and core strengthening. Updated lumbar ROM with significant improvement noted. Pt equal bilateral with lateral flexion and rotation. Only mild tightness reported with R lateral flexion. He has  met all STGs.   OBJECTIVE IMPAIRMENTS: decreased activity tolerance, decreased mobility, difficulty walking, decreased ROM, decreased strength,  hypomobility, impaired flexibility, and pain.   ACTIVITY LIMITATIONS: bending, standing, and locomotion level  PARTICIPATION LIMITATIONS: yard work and recreational activities  PERSONAL FACTORS: Time since onset of injury/illness/exacerbation and 3+ comorbidities: arthritis, cervical fusion, and L knee arthroscopic surgery   are also affecting patient's functional outcome.   REHAB POTENTIAL: Good  CLINICAL DECISION MAKING: Stable/uncomplicated  EVALUATION COMPLEXITY: Low   GOALS:  SHORT TERM GOALS: Target date: 03/20/2023   Pt will be independent and compliant with HEP for improved pain, ROM, strength, and function.  Baseline: Goal status: MET 7/30  2.  Pt will demo L lumbar rotation AROM to be [facility] for improved tightness and mobility.  Baseline:  Goal status:  MET 7/30  3.  Pt will report at least a 25% improvement in pain with standing activities.  Baseline:  Goal status: MET 7/30    LONG TERM GOALS: Target date: 04/10/2023   Pt will demonstrate improved flexibility in HS in order to reduce strain on lumbar and improve mobility.  Baseline:  Goal status: INITIAL  2.  Pt will demo improved core strength as evidenced by performance and progression of core exercises without adverse effects and improved R hip ext strength to 5/5 MMT for improved performance of and tolerance with functional mobility and recreational activities.  Baseline:  Goal status: INITIAL  3.  Pt will report he is able to perform his normal standing activities including his volunteer activities without significant lumbar pain.  Baseline:  Goal status: INITIAL  4.  Pt will demo correct form and body mechanics with hip hinge and squatting to lift in order to perform his yard work and functional lifting with reduced stress and strain on his back. Baseline:  Goal status: INITIAL  5.  Pt will report improved tolerance/reduced pain with yard work.   Baseline:  Goal status: INITIAL    PLAN:  PT  FREQUENCY: 1-2x/week  PT DURATION: 6 weeks  PLANNED INTERVENTIONS: Therapeutic exercises, Therapeutic activity, Neuromuscular re-education, Balance training, Gait training, Patient/Family education, Self Care, Joint mobilization, Stair training, Aquatic Therapy, Dry Needling, Electrical stimulation, Spinal mobilization, Cryotherapy, Moist heat, Taping, Traction, Ultrasound, Manual therapy, and Re-evaluation.  PLAN FOR NEXT SESSION: Cont with core strengthening, HS flexibility, and STW to lumbar.   Riki Altes, PTA  04/03/23 2:24 PM

## 2023-04-05 ENCOUNTER — Encounter (HOSPITAL_BASED_OUTPATIENT_CLINIC_OR_DEPARTMENT_OTHER): Payer: 59 | Admitting: Physical Therapy

## 2023-04-10 ENCOUNTER — Ambulatory Visit (HOSPITAL_BASED_OUTPATIENT_CLINIC_OR_DEPARTMENT_OTHER): Payer: 59 | Attending: Internal Medicine

## 2023-04-10 ENCOUNTER — Encounter (HOSPITAL_BASED_OUTPATIENT_CLINIC_OR_DEPARTMENT_OTHER): Payer: Self-pay

## 2023-04-10 DIAGNOSIS — M6281 Muscle weakness (generalized): Secondary | ICD-10-CM | POA: Insufficient documentation

## 2023-04-10 DIAGNOSIS — M5459 Other low back pain: Secondary | ICD-10-CM | POA: Insufficient documentation

## 2023-04-10 NOTE — Therapy (Addendum)
OUTPATIENT PHYSICAL THERAPY THORACOLUMBAR TREATMENT PHYSICAL THERAPY DISCHARGE SUMMARY  Visits from Start of Care: 9  Current functional level related to goals / functional outcomes: improving   Remaining deficits: pain   Education / Equipment: Management of condition/HEP   Patient agrees to discharge. Patient goals were partially met. Patient is being discharged due to not returning since the last visit.  Addend Corrie Dandy Tomma Lightning) Ziemba MPT 10/25/23 1:24 PM [facility] [address] [place], [place], [address] Phone: [phone]   Fax:  [phone]    Patient Name: Andrew Nolan MRN: [id number] DOB:August 05, 1961, 62 y.o., male Today's Date: 04/10/2023  END OF SESSION:  PT End of Session - 04/10/23 1322     Visit Number 9    Number of Visits 10    Date for PT Re-Evaluation 04/10/23    Authorization Type [employer]    PT Start Time 1145    PT Stop Time 1230    PT Time Calculation (min) 45 min    Activity Tolerance Patient tolerated treatment well    Behavior During Therapy [facility] for tasks assessed/performed                   Past Medical History:  Diagnosis Date   Allergy    Arthritis    ECRB (extensor carpi radialis brevis) tenosynovitis    GERD (gastroesophageal reflux disease)    occasional    Hypertension    Lateral epicondylitis of right elbow    Medial epicondylitis of right elbow    PONV (postoperative nausea and vomiting)    only with one surgery in 2005, no other problems with other surgeries   Shingles    Wears glasses    Past Surgical History:  Procedure Laterality Date   COLONOSCOPY  06/17/2009   dr Arlyce Dice    FRACTURE SURGERY     lt thumb-age 71   LATERAL EPICONDYLE RELEASE Right 11/11/2013   Procedure: RIGHT TENOTOMY ELBOW LATERAL EPICONDYLITIS TENNIS ELBOW/RIGHT ELBOW INJECTION ASPIRATION ARTHROCENTESIS INTERMEDULLARY JOINT BURSA;  Surgeon: Nilda Simmer, MD;  Location: [facility];  Service: Orthopedics;  Laterality: Right;   ORIF METACARPAL FRACTURE  1982   right hand   ORIF RADIUS & ULNA FRACTURES  1976   compound-right   ORIF WRIST FRACTURE  1975   left   SHOULDER ARTHROSCOPY W/ LABRAL REPAIR  1999   right   SHOULDER ARTHROSCOPY W/ ROTATOR CUFF REPAIR  2005   left   UPPER GI ENDOSCOPY     VASECTOMY     WISDOM TOOTH EXTRACTION     Patient Active Problem List   Diagnosis Date Noted   Pain due to onychomycosis of toenails of both feet 01/17/2023   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   Class 1 obesity due to excess calories with body mass index (BMI) of 30.0 to 30.9 in adult 07/15/2021   Hypertension    Tinnitus of both ears 03/25/2021   Seasonal and perennial allergic rhinitis 12/09/2019   ECRB (extensor carpi radialis brevis) tenosynovitis    Medial epicondylitis of right elbow    Lateral epicondylitis of right elbow     PCP: Charlane Ferretti, DO  REFERRING PROVIDER: Charlane Ferretti, DO  REFERRING DIAG: M54.9 (ICD-10-CM) - Back Pain  Rationale for Evaluation and Treatment: Rehabilitation  THERAPY DIAG:  Other low back pain  Muscle weakness (generalized)  ONSET DATE: February 2024  SUBJECTIVE:  SUBJECTIVE STATEMENT:  Pt reports he had some posterior leg pain while driving for 9 1/2 hours. Feels fine now. Had some back pain with walking yesterday, so did not walk as far.   PERTINENT HISTORY:  Chronic lumbar pain Arthritis, HTN, and cervical spondylosis PSHx:  Cervical Fusion 12/18/2019, L knee arthroscopic surgery for meniscus 01/19, R shoulder surgery in 1999 and L shoulder RCR in 2005, R elbow lateral epicondyle release in 2015, and R wrist surgery    PAIN:  Are you having pain? Yes NPRS:  1/10 current, 6-7/10 worst, 0/10 best Location:  R sided  lumbar pain Pt has occasional numbness in L thigh.  He can have some pain traveling to groin and knee when it is really aggravated.    PRECAUTIONS: Other: cervical fusion  WEIGHT BEARING RESTRICTIONS: No  FALLS:  Has patient fallen in last 6 months? 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  Pt was able to play pickleball.  PATIENT GOALS: wants to be able to play pickleball.  To be able to ski without pain.  Decreased pain.    OBJECTIVE:   LUMBAR ROM:    AROM eval 7/30  Flexion WFL WFL  Extension WFL with little pain   Right lateral flexion Just proximal to  lateral knee jt line Just proximal to laterl knee jt line Mild tightness  Left lateral flexion Just proximal to  lateral knee jt line with tightness and little pain Just proximal to lateral knee jt line  Right rotation [facility] WFL  Left rotation 75% WFL   (Blank rows = not tested)  DIAGNOSTIC FINDINGS:  None  TODAY'S TREATMENT:                                                                                                                                Therapeutic Exercise:  Staggered bridge 3" hold 2x10ea Supine SLR with TrA 2x10 Piriformis stretch 3x30s ea Manual HSS 30sec x3ea R ITB stretch 30sec x3  Supine shoulder ext with PPT 2x10 with 3#  Supine lumbar rotation x15 Prone hip extension 2x10ea Sidleying abduction 3x10ea Standing lateral flexion with 10lb KB 2x10ea Palloff press at cable column 10lbs 3x10ea   PATIENT EDUCATION:  Education details: dx, relevant anatomy, HEP, and POC.  Person educated: Patient Education method:  Explanation, Demonstration, Tactile cues, Verbal cues Education comprehension: verbalized understanding, returned demonstration, verbal cues required, and tactile cues required  HOME EXERCISE  PROGRAM: Access Code: [email or web address] URL: [email or web address] Date: 02/27/2023 Prepared by: Aaron Edelman   ASSESSMENT:  CLINICAL IMPRESSION: Pt with increased tightness in R LE so spent time on stretching to relieve this. Continued with core strengthening as well as hip strengthening with good tolerance. He continues to be challenged by sidelying hip abduction. No  c/o discomfort in low back with exercises. Pt to be re-evaluated next session.  OBJECTIVE IMPAIRMENTS: decreased activity tolerance, decreased mobility, difficulty walking, decreased ROM, decreased strength, hypomobility, impaired flexibility, and pain.   ACTIVITY LIMITATIONS: bending, standing, and locomotion level  PARTICIPATION LIMITATIONS: yard work and recreational activities  PERSONAL FACTORS: Time since onset of injury/illness/exacerbation and 3+ comorbidities: arthritis, cervical fusion, and L knee arthroscopic surgery   are also affecting patient's functional outcome.   REHAB POTENTIAL: Good  CLINICAL DECISION MAKING: Stable/uncomplicated  EVALUATION COMPLEXITY: Low   GOALS:  SHORT TERM GOALS: Target date: 03/20/2023   Pt will be independent and compliant with HEP for improved pain, ROM, strength, and function.  Baseline: Goal status: MET 7/30  2.  Pt will demo L lumbar rotation AROM to be [facility] for improved tightness and mobility.  Baseline:  Goal status:  MET 7/30  3.  Pt will report at least a 25% improvement in pain with standing activities.  Baseline:  Goal status: MET 7/30    LONG TERM GOALS: Target date: 04/10/2023   Pt will demonstrate improved flexibility in HS in order to reduce strain on lumbar and improve mobility.  Baseline:  Goal status: INITIAL  2.  Pt will demo improved core strength as evidenced by performance and progression of core exercises without adverse effects and improved R hip ext strength to 5/5 MMT for improved performance of and tolerance with functional  mobility and recreational activities.  Baseline:  Goal status: INITIAL  3.  Pt will report he is able to perform his normal standing activities including his volunteer activities without significant lumbar pain.  Baseline:  Goal status: INITIAL  4.  Pt will demo correct form and body mechanics with hip hinge and squatting to lift in order to perform his yard work and functional lifting with reduced stress and strain on his back. Baseline:  Goal status: INITIAL  5.  Pt will report improved tolerance/reduced pain with yard work.   Baseline:  Goal status: INITIAL    PLAN:  PT FREQUENCY: 1-2x/week  PT DURATION: 6 weeks  PLANNED INTERVENTIONS: Therapeutic exercises, Therapeutic activity, Neuromuscular re-education, Balance training, Gait training, Patient/Family education, Self Care, Joint mobilization, Stair training, Aquatic Therapy, Dry Needling, Electrical stimulation, Spinal mobilization, Cryotherapy, Moist heat, Taping, Traction, Ultrasound, Manual therapy, and Re-evaluation.  PLAN FOR NEXT SESSION: Cont with core strengthening, HS flexibility, and STW to lumbar.   Riki Altes, PTA  04/10/23 1:26 PM

## 2023-04-12 ENCOUNTER — Ambulatory Visit (HOSPITAL_BASED_OUTPATIENT_CLINIC_OR_DEPARTMENT_OTHER): Payer: 59 | Admitting: Physical Therapy

## 2023-05-21 ENCOUNTER — Ambulatory Visit: Payer: 59 | Admitting: Podiatry

## 2023-06-06 ENCOUNTER — Other Ambulatory Visit (HOSPITAL_BASED_OUTPATIENT_CLINIC_OR_DEPARTMENT_OTHER): Payer: Self-pay

## 2023-06-06 MED ORDER — COMIRNATY 30 MCG/0.3ML IM SUSY
0.3000 mL | PREFILLED_SYRINGE | Freq: Once | INTRAMUSCULAR | 0 refills | Status: AC
  Filled 2023-06-06: qty 0.3, 1d supply, fill #0

## 2023-06-06 MED ORDER — INFLUENZA VIRUS VACC SPLIT PF (FLUZONE) 0.5 ML IM SUSY
0.5000 mL | PREFILLED_SYRINGE | Freq: Once | INTRAMUSCULAR | 0 refills | Status: AC
  Filled 2023-06-06: qty 0.5, 1d supply, fill #0

## 2023-06-17 IMAGING — US US SOFT TISSUE HEAD/NECK
1 series · 14 of 14 positions shown · non-contrast
Comparison: None.

CLINICAL DATA: Palpable lump on the left neck for many years.
Possible lipoma.

EXAM:
ULTRASOUND OF HEAD/NECK SOFT TISSUES
TECHNIQUE: Ultrasound examination of the head and neck soft tissues was
performed in the area of clinical concern.

[Series 1: us soft tissue head/neck · 0.05mm/px · 14 acquisitions, 14 frames shown]
[im 1/14]
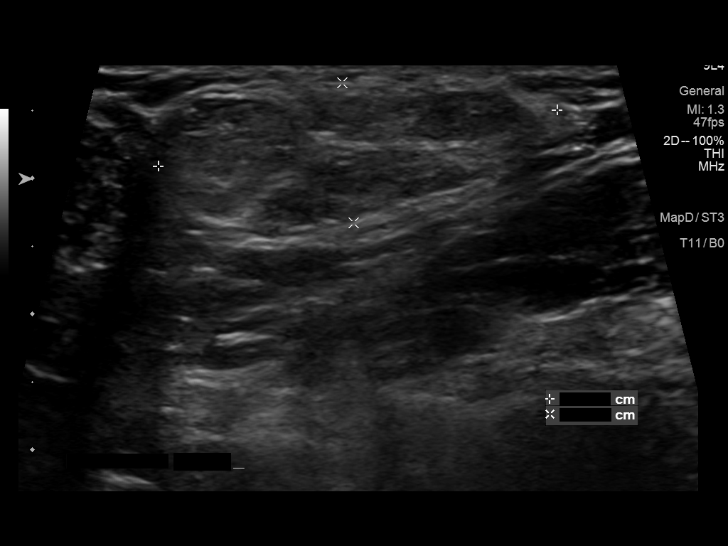
[im 2/14]
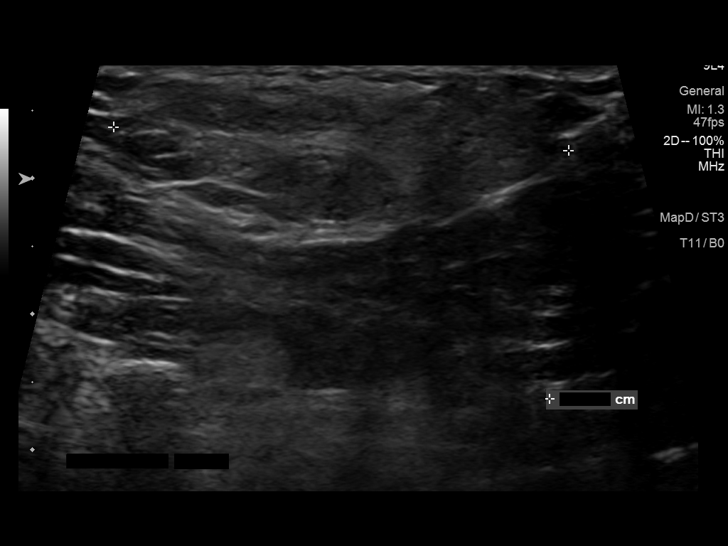
[im 3/14]
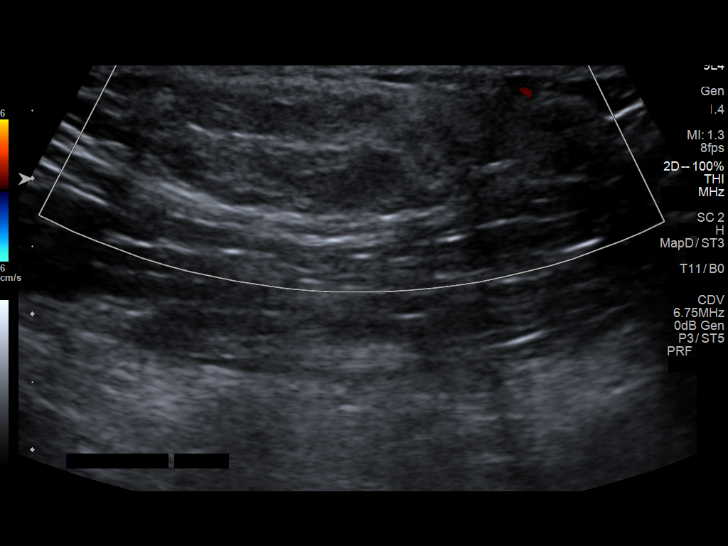
[im 4/14]
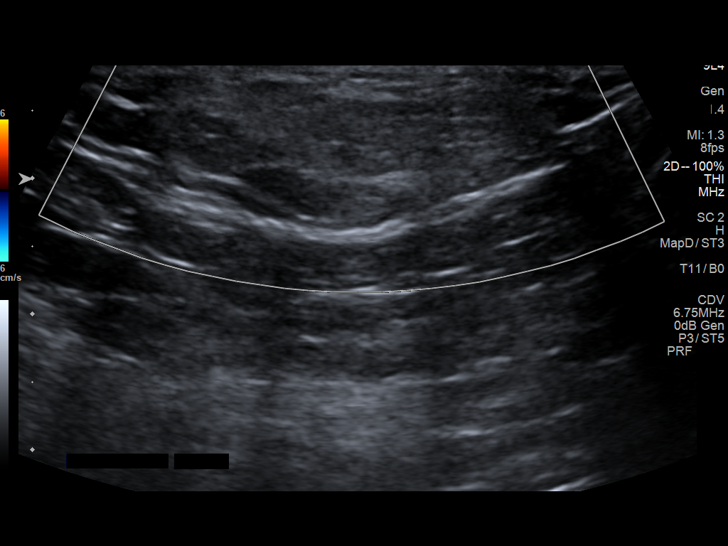
[im 5/14]
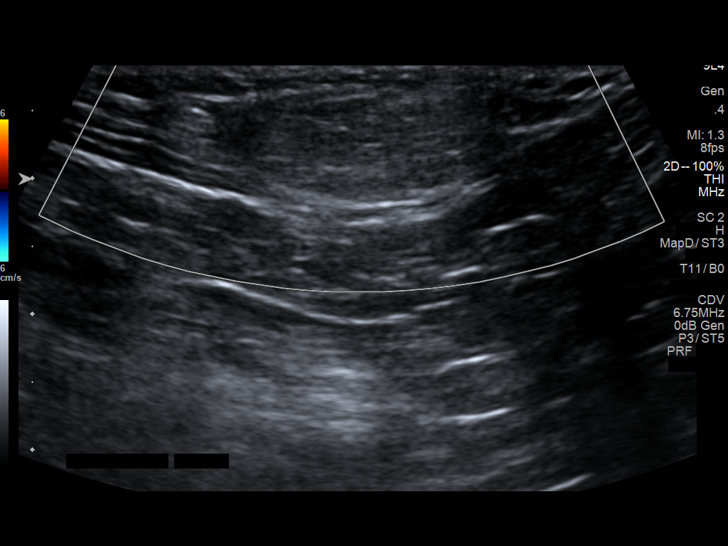
[im 6/14]
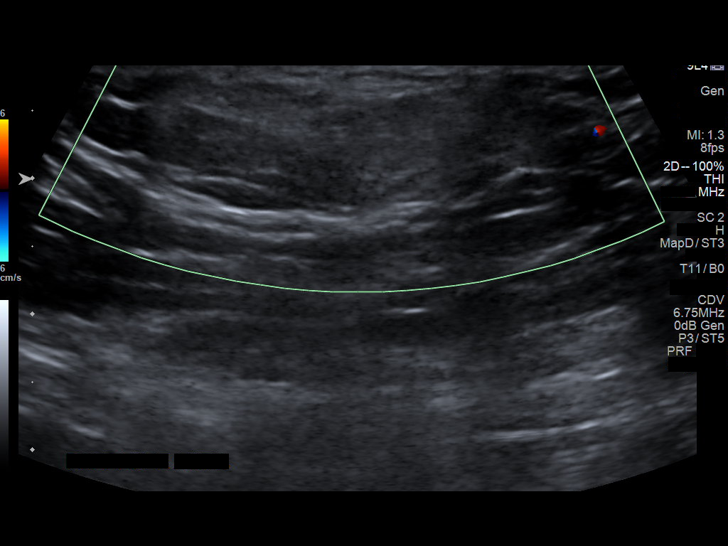
[im 7/14]
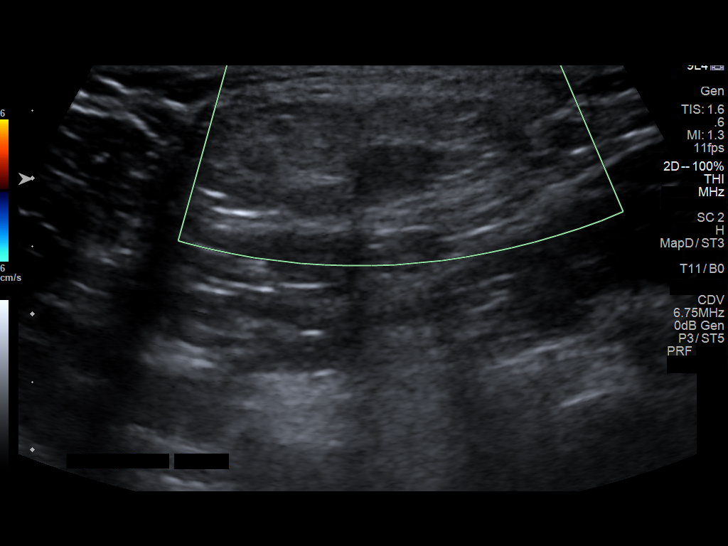
[im 8/14]
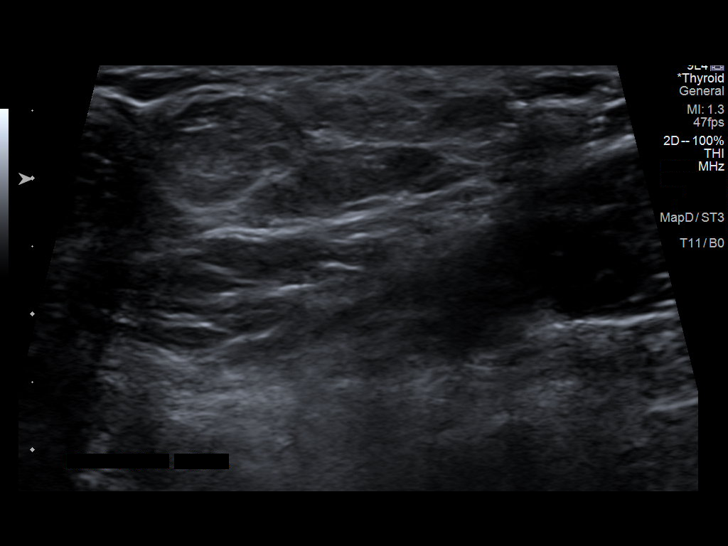
[im 9/14]
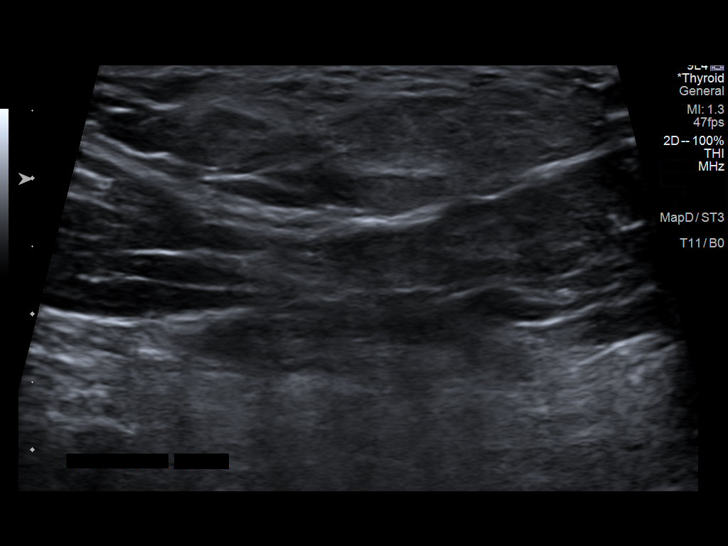
[im 10/14]
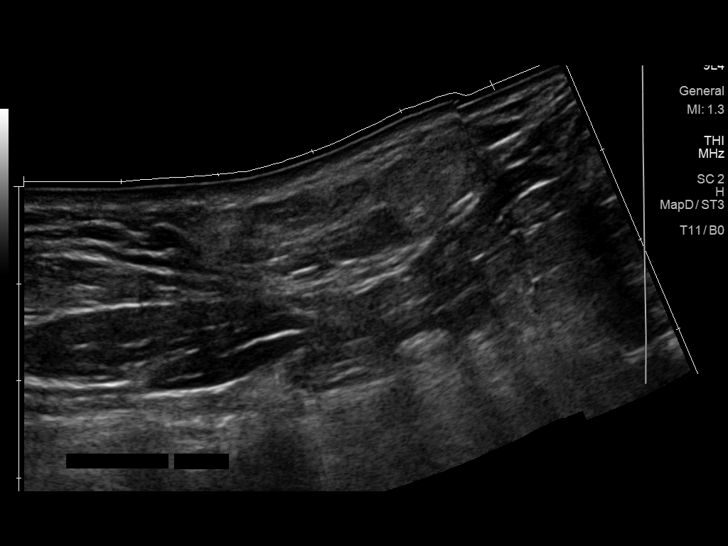
[im 11/14]
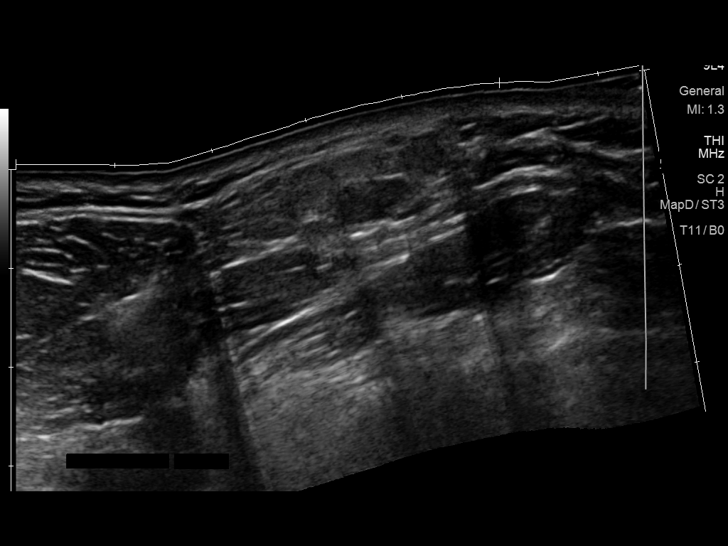
[im 12/14]
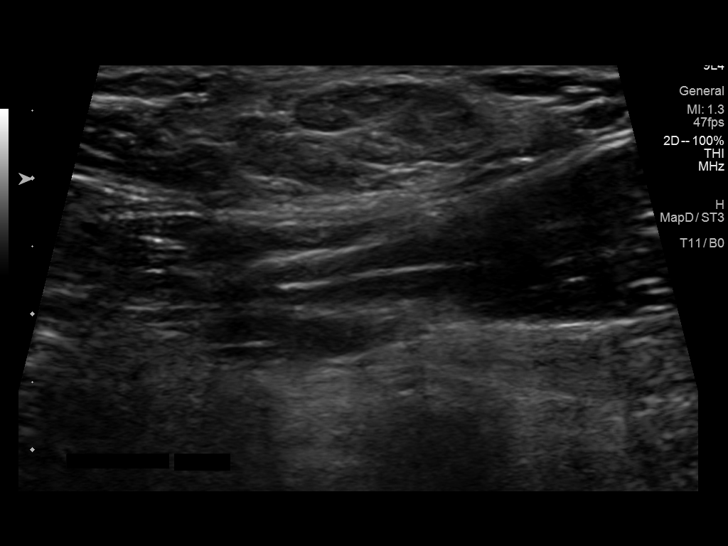
[im 13/14]
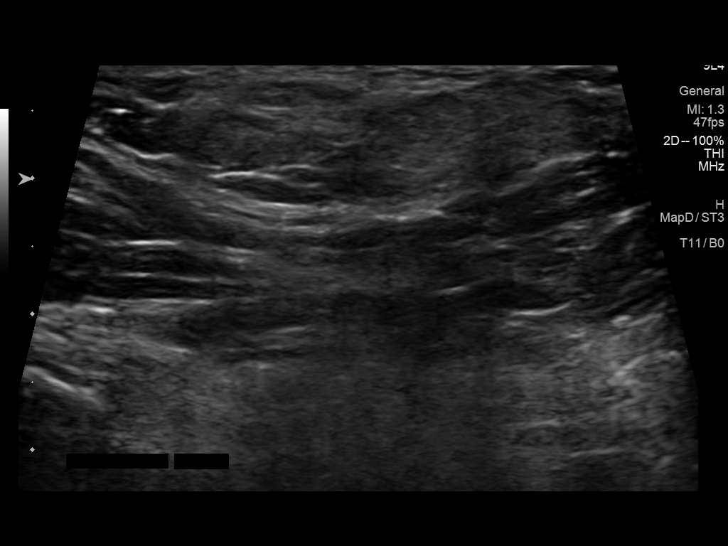
[im 14/14]
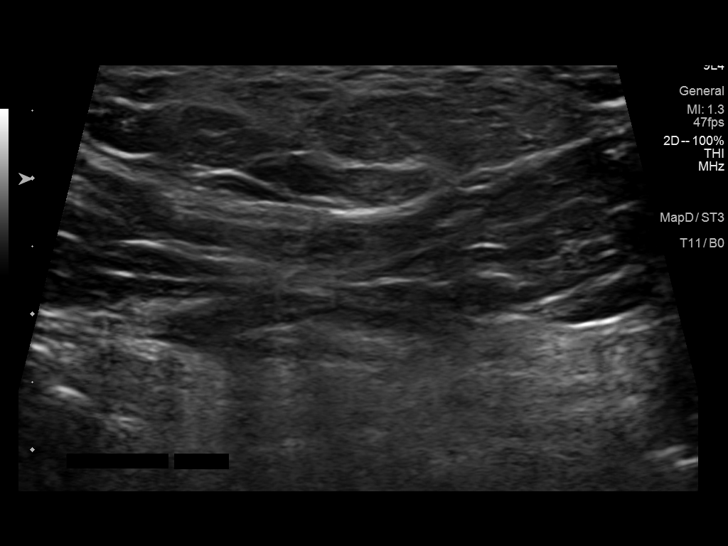

[14 of 14 positions shown; findings below may reference images not displayed]

FINDINGS: Sonographic interrogation of the region of clinical concern
demonstrates an ovoid heterogeneously echogenic soft tissue mass in
the region of clinical concern which measures 3.0 x 1.0 x 3.4 cm.
The echogenicity of the structure is similar to that of the adjacent
adipose tissue. The structure is positioned in the superficial
subcutaneous fat overlying the sternocleidomastoid muscle. No
evidence of internal vascularity. Sonographically, the appearance is
most consistent with that of a benign lipoma.
IMPRESSION: Approximately 3.0 x 1.0 x 3.4 cm fatty mass positioned in the
superficial soft tissues overlying the left sternocleidomastoid
muscle. By imaging, the abnormality is most consistent with that of
a lipoma.

## 2023-08-01 ENCOUNTER — Other Ambulatory Visit (HOSPITAL_BASED_OUTPATIENT_CLINIC_OR_DEPARTMENT_OTHER): Payer: Self-pay | Admitting: Internal Medicine

## 2023-08-01 DIAGNOSIS — E785 Hyperlipidemia, unspecified: Secondary | ICD-10-CM

## 2023-09-17 ENCOUNTER — Ambulatory Visit (HOSPITAL_BASED_OUTPATIENT_CLINIC_OR_DEPARTMENT_OTHER)
Admission: RE | Admit: 2023-09-17 | Discharge: 2023-09-17 | Disposition: A | Discharge status: Home / Self Care | Payer: Self-pay | Source: Ambulatory Visit | Attending: Internal Medicine | Admitting: Internal Medicine

## 2023-09-17 DIAGNOSIS — E785 Hyperlipidemia, unspecified: Secondary | ICD-10-CM | POA: Insufficient documentation
